# Patient Record
Sex: Female | Born: 1950 | Race: White | Hispanic: No | Marital: Married | Smoking: Former smoker
Health system: Southern US, Community
[De-identification: ages and names within clinical notes are randomized; demographics above are authoritative.]

## PROBLEM LIST (undated history)

## (undated) DIAGNOSIS — K219 Gastro-esophageal reflux disease without esophagitis: Secondary | ICD-10-CM

## (undated) DIAGNOSIS — F329 Major depressive disorder, single episode, unspecified: Secondary | ICD-10-CM

## (undated) DIAGNOSIS — F32A Depression, unspecified: Secondary | ICD-10-CM

## (undated) HISTORY — PX: ABDOMINAL HYSTERECTOMY: SHX81

## (undated) HISTORY — PX: CHOLECYSTECTOMY: SHX55

## (undated) HISTORY — PX: TONSILLECTOMY: SUR1361

---

## 2005-02-02 ENCOUNTER — Ambulatory Visit: Payer: Self-pay | Admitting: Specialist

## 2005-06-30 ENCOUNTER — Ambulatory Visit: Payer: Self-pay | Admitting: Cardiology

## 2006-09-22 ENCOUNTER — Ambulatory Visit: Payer: Self-pay | Admitting: Unknown Physician Specialty

## 2006-10-26 ENCOUNTER — Ambulatory Visit: Payer: Self-pay | Admitting: Unknown Physician Specialty

## 2006-11-26 ENCOUNTER — Emergency Department: Payer: Self-pay | Admitting: Internal Medicine

## 2006-12-29 ENCOUNTER — Ambulatory Visit: Payer: Self-pay | Admitting: Nurse Practitioner

## 2007-08-04 ENCOUNTER — Ambulatory Visit: Payer: Self-pay | Admitting: Family Medicine

## 2008-08-21 ENCOUNTER — Ambulatory Visit: Payer: Self-pay | Admitting: Internal Medicine

## 2009-09-26 ENCOUNTER — Ambulatory Visit: Payer: Self-pay | Admitting: Family Medicine

## 2010-01-30 ENCOUNTER — Ambulatory Visit: Payer: Self-pay | Admitting: Family Medicine

## 2010-06-17 ENCOUNTER — Ambulatory Visit: Payer: Self-pay | Admitting: Unknown Physician Specialty

## 2010-08-07 ENCOUNTER — Ambulatory Visit: Payer: Self-pay | Admitting: Family Medicine

## 2010-09-29 ENCOUNTER — Ambulatory Visit: Payer: Self-pay | Admitting: Obstetrics and Gynecology

## 2011-02-04 ENCOUNTER — Ambulatory Visit: Payer: Self-pay | Admitting: Unknown Physician Specialty

## 2011-05-24 ENCOUNTER — Ambulatory Visit: Payer: Self-pay | Admitting: Internal Medicine

## 2011-06-05 ENCOUNTER — Other Ambulatory Visit: Payer: Self-pay | Admitting: Unknown Physician Specialty

## 2011-06-22 ENCOUNTER — Ambulatory Visit: Payer: Self-pay | Admitting: Internal Medicine

## 2011-06-25 DIAGNOSIS — K219 Gastro-esophageal reflux disease without esophagitis: Secondary | ICD-10-CM | POA: Insufficient documentation

## 2011-06-25 DIAGNOSIS — M8580 Other specified disorders of bone density and structure, unspecified site: Secondary | ICD-10-CM | POA: Insufficient documentation

## 2011-06-25 DIAGNOSIS — Z8719 Personal history of other diseases of the digestive system: Secondary | ICD-10-CM | POA: Insufficient documentation

## 2011-10-01 ENCOUNTER — Ambulatory Visit: Payer: Self-pay | Admitting: Obstetrics and Gynecology

## 2011-10-09 DIAGNOSIS — E7800 Pure hypercholesterolemia, unspecified: Secondary | ICD-10-CM | POA: Insufficient documentation

## 2011-10-09 DIAGNOSIS — K582 Mixed irritable bowel syndrome: Secondary | ICD-10-CM | POA: Insufficient documentation

## 2012-01-07 ENCOUNTER — Ambulatory Visit: Payer: Self-pay | Admitting: Podiatry

## 2012-09-01 ENCOUNTER — Ambulatory Visit: Payer: Self-pay | Admitting: Family Medicine

## 2012-10-04 ENCOUNTER — Ambulatory Visit: Payer: Self-pay | Admitting: Family Medicine

## 2012-12-20 ENCOUNTER — Ambulatory Visit: Payer: Self-pay | Admitting: Family Medicine

## 2012-12-20 LAB — URINALYSIS, COMPLETE
Bacteria: NEGATIVE
Bilirubin,UR: NEGATIVE
Blood: NEGATIVE
Ketone: NEGATIVE
Ph: 6.5 (ref 4.5–8.0)
Protein: NEGATIVE
Specific Gravity: 1.01 (ref 1.003–1.030)

## 2013-04-05 ENCOUNTER — Ambulatory Visit: Payer: Self-pay | Admitting: Ophthalmology

## 2013-06-22 ENCOUNTER — Ambulatory Visit: Payer: Self-pay

## 2013-06-22 LAB — COMPREHENSIVE METABOLIC PANEL
Albumin: 4.2 g/dL (ref 3.4–5.0)
Alkaline Phosphatase: 74 U/L (ref 50–136)
Anion Gap: 12 (ref 7–16)
BUN: 13 mg/dL (ref 7–18)
Calcium, Total: 9.4 mg/dL (ref 8.5–10.1)
Chloride: 106 mmol/L (ref 98–107)
Co2: 25 mmol/L (ref 21–32)
Creatinine: 0.82 mg/dL (ref 0.60–1.30)
EGFR (African American): >60
EGFR (Non-African Amer.): >60
Glucose: 88 mg/dL (ref 65–99)
Osmolality: 285 (ref 275–301)
Potassium: 4 mmol/L (ref 3.5–5.1)
SGOT(AST): 11 U/L — ABNORMAL LOW (ref 15–37)
SGPT (ALT): 20 U/L (ref 12–78)
Total Protein: 7.8 g/dL (ref 6.4–8.2)

## 2013-06-22 LAB — CBC WITH DIFFERENTIAL/PLATELET
Basophil #: 0.1 10*3/uL (ref 0.0–0.1)
Basophil %: 1.3 %
Eosinophil #: 0.1 10*3/uL (ref 0.0–0.7)
Eosinophil %: 1.5 %
HGB: 14.7 g/dL (ref 12.0–16.0)
Lymphocyte #: 2.8 10*3/uL (ref 1.0–3.6)
Lymphocyte %: 29.8 %
MCV: 83 fL (ref 80–100)
Monocyte #: 0.7 x10 3/mm (ref 0.2–0.9)
Neutrophil %: 60 %
RBC: 5.2 10*6/uL (ref 3.80–5.20)
WBC: 9.4 10*3/uL (ref 3.6–11.0)

## 2013-06-22 LAB — URINALYSIS, COMPLETE
Glucose,UR: NEGATIVE mg/dL (ref 0–75)
Nitrite: NEGATIVE
Protein: NEGATIVE
RBC,UR: NONE SEEN /HPF (ref 0–5)
Specific Gravity: 1.02 (ref 1.003–1.030)

## 2013-06-22 LAB — MAGNESIUM: Magnesium: 2.1 mg/dL

## 2013-06-24 LAB — URINE CULTURE

## 2013-10-10 ENCOUNTER — Ambulatory Visit: Payer: Self-pay | Admitting: Internal Medicine

## 2014-02-05 ENCOUNTER — Ambulatory Visit: Payer: Self-pay | Admitting: Family Medicine

## 2014-02-05 LAB — BASIC METABOLIC PANEL
Anion Gap: 10 (ref 7–16)
BUN: 11 mg/dL (ref 7–18)
CO2: 27 mmol/L (ref 21–32)
Calcium, Total: 9 mg/dL (ref 8.5–10.1)
Chloride: 105 mmol/L (ref 98–107)
Creatinine: 0.73 mg/dL (ref 0.60–1.30)
EGFR (African American): >60
EGFR (Non-African Amer.): >60
Glucose: 95 mg/dL (ref 65–99)
OSMOLALITY: 282 (ref 275–301)
Potassium: 4.2 mmol/L (ref 3.5–5.1)
Sodium: 142 mmol/L (ref 136–145)

## 2014-02-05 LAB — CBC WITH DIFFERENTIAL/PLATELET
BASOS ABS: 0.1 10*3/uL (ref 0.0–0.1)
Basophil %: 1.2 %
EOS ABS: 0.2 10*3/uL (ref 0.0–0.7)
Eosinophil %: 2 %
HCT: 44.3 % (ref 35.0–47.0)
HGB: 14.2 g/dL (ref 12.0–16.0)
Lymphocyte #: 2.7 10*3/uL (ref 1.0–3.6)
Lymphocyte %: 30.5 %
MCH: 27.3 pg (ref 26.0–34.0)
MCHC: 32 g/dL (ref 32.0–36.0)
MCV: 85 fL (ref 80–100)
MONO ABS: 0.7 x10 3/mm (ref 0.2–0.9)
Monocyte %: 7.8 %
NEUTROS ABS: 5.2 10*3/uL (ref 1.4–6.5)
NEUTROS PCT: 58.5 %
PLATELETS: 288 10*3/uL (ref 150–440)
RBC: 5.19 10*6/uL (ref 3.80–5.20)
RDW: 14.1 % (ref 11.5–14.5)
WBC: 8.9 10*3/uL (ref 3.6–11.0)

## 2014-02-05 LAB — MAGNESIUM: MAGNESIUM: 2.3 mg/dL

## 2014-02-05 LAB — MONONUCLEOSIS SCREEN: Mono Test: NEGATIVE

## 2014-06-19 ENCOUNTER — Ambulatory Visit: Payer: Self-pay | Admitting: Unknown Physician Specialty

## 2014-07-13 DIAGNOSIS — M169 Osteoarthritis of hip, unspecified: Secondary | ICD-10-CM | POA: Insufficient documentation

## 2014-08-18 ENCOUNTER — Ambulatory Visit: Payer: Self-pay | Admitting: Emergency Medicine

## 2014-10-12 ENCOUNTER — Ambulatory Visit: Payer: Self-pay | Admitting: Family Medicine

## 2015-04-01 ENCOUNTER — Other Ambulatory Visit: Payer: Self-pay | Admitting: Family Medicine

## 2015-04-01 DIAGNOSIS — M8580 Other specified disorders of bone density and structure, unspecified site: Secondary | ICD-10-CM

## 2015-04-23 ENCOUNTER — Ambulatory Visit
Admission: RE | Admit: 2015-04-23 | Discharge: 2015-04-23 | Disposition: A | Discharge status: Home / Self Care | Payer: BLUE CROSS/BLUE SHIELD | Source: Ambulatory Visit | Attending: Family Medicine | Admitting: Family Medicine

## 2015-04-23 DIAGNOSIS — M8580 Other specified disorders of bone density and structure, unspecified site: Secondary | ICD-10-CM | POA: Diagnosis not present

## 2015-09-23 ENCOUNTER — Other Ambulatory Visit: Payer: Self-pay | Admitting: Family Medicine

## 2015-09-23 DIAGNOSIS — Z1231 Encounter for screening mammogram for malignant neoplasm of breast: Secondary | ICD-10-CM

## 2015-10-16 ENCOUNTER — Ambulatory Visit: Payer: BLUE CROSS/BLUE SHIELD

## 2015-10-22 ENCOUNTER — Ambulatory Visit
Admission: RE | Admit: 2015-10-22 | Discharge: 2015-10-22 | Disposition: A | Discharge status: Home / Self Care | Payer: BLUE CROSS/BLUE SHIELD | Source: Ambulatory Visit | Attending: Family Medicine | Admitting: Family Medicine

## 2015-10-22 DIAGNOSIS — Z1231 Encounter for screening mammogram for malignant neoplasm of breast: Secondary | ICD-10-CM | POA: Diagnosis present

## 2015-11-12 ENCOUNTER — Encounter: Payer: Self-pay | Admitting: Medical Oncology

## 2015-11-12 ENCOUNTER — Emergency Department: Payer: BLUE CROSS/BLUE SHIELD

## 2015-11-12 ENCOUNTER — Emergency Department
Admission: EM | Admit: 2015-11-12 | Discharge: 2015-11-12 | Disposition: A | Discharge status: Home / Self Care | Payer: BLUE CROSS/BLUE SHIELD | Attending: Emergency Medicine | Admitting: Emergency Medicine

## 2015-11-12 DIAGNOSIS — K219 Gastro-esophageal reflux disease without esophagitis: Secondary | ICD-10-CM | POA: Insufficient documentation

## 2015-11-12 DIAGNOSIS — R079 Chest pain, unspecified: Secondary | ICD-10-CM | POA: Diagnosis present

## 2015-11-12 DIAGNOSIS — F17200 Nicotine dependence, unspecified, uncomplicated: Secondary | ICD-10-CM | POA: Diagnosis not present

## 2015-11-12 HISTORY — DX: Depression, unspecified: F32.A

## 2015-11-12 HISTORY — DX: Major depressive disorder, single episode, unspecified: F32.9

## 2015-11-12 HISTORY — DX: Gastro-esophageal reflux disease without esophagitis: K21.9

## 2015-11-12 LAB — BASIC METABOLIC PANEL
ANION GAP: 7 (ref 5–15)
BUN: 15 mg/dL (ref 6–20)
CALCIUM: 9 mg/dL (ref 8.9–10.3)
CO2: 22 mmol/L (ref 22–32)
Chloride: 111 mmol/L (ref 101–111)
Creatinine, Ser: 0.78 mg/dL (ref 0.44–1.00)
GFR calc Af Amer: >60 mL/min (ref >60)
GFR calc non Af Amer: >60 mL/min (ref >60)
GLUCOSE: 129 mg/dL — AB (ref 65–99)
POTASSIUM: 3.5 mmol/L (ref 3.5–5.1)
Sodium: 140 mmol/L (ref 135–145)

## 2015-11-12 LAB — CBC
HEMATOCRIT: 40.6 % (ref 35.0–47.0)
HEMOGLOBIN: 13.6 g/dL (ref 12.0–16.0)
MCH: 27.9 pg (ref 26.0–34.0)
MCHC: 33.6 g/dL (ref 32.0–36.0)
MCV: 83.2 fL (ref 80.0–100.0)
Platelets: 259 10*3/uL (ref 150–440)
RBC: 4.88 MIL/uL (ref 3.80–5.20)
RDW: 14.2 % (ref 11.5–14.5)
WBC: 10.7 10*3/uL (ref 3.6–11.0)

## 2015-11-12 LAB — TROPONIN I: Troponin I: <0.03 ng/mL (ref <0.031)

## 2015-11-12 MED ORDER — GI COCKTAIL ~~LOC~~
30.0000 mL | Freq: Once | ORAL | Status: AC
  Administered 2015-11-12: 30 mL via ORAL

## 2015-11-12 MED ORDER — SUCRALFATE 1 G PO TABS: 1.0000 g | ORAL_TABLET | Freq: Four times a day (QID) | ORAL | Status: DC

## 2015-11-12 MED ORDER — GI COCKTAIL ~~LOC~~
ORAL | Status: AC
  Filled 2015-11-12: qty 30

## 2015-11-12 NOTE — ED Provider Notes (Signed)
[facility] Emergency Department Provider Note   ____________________________________________  Time seen: 2030  I have reviewed the triage vital signs and the nursing notes.   HISTORY  Chief Complaint Chest Pain   History limited by: Not Limited   HPI Carly Lopez is a 64 y.o. female with a history of heartburn and presents to the emergency department today with concerns for chest pain and throat pain. Patient states that the symptoms started 3 days ago. Patient describes the pain primarily is burning. She also feels a bad taste in her throat. It has been intermittent. It is worse when she lies flat. She states she does have a history of heartburn instability on Protonix for that. She states she was off of her Protonix for a couple of weeks when she was out of town. She states that in addition she has been taking a larger than normal amount of ibuprofen recently for knee pain. Patient denies any shortness of breath or diaphoresis. She denies any fevers.  Past Medical History  Diagnosis Date  . GERD (gastroesophageal reflux disease)   . Depression     There are no active problems to display for this patient.   Past Surgical History  Procedure Laterality Date  . Abdominal hysterectomy    . Cholecystectomy    . Tonsillectomy      No current outpatient prescriptions on file.  Allergies Aleve and Levaquin  Family History  Problem Relation Age of Onset  . Breast cancer Maternal Aunt     Social History Social History  Substance Use Topics  . Smoking status: Current Every Day Smoker  . Smokeless tobacco: None  . Alcohol Use: No    Review of Systems  Constitutional: Negative for fever. Cardiovascular: Positive for chest pain. Respiratory: Negative for shortness of breath. Gastrointestinal: Negative for abdominal pain, vomiting and diarrhea. Genitourinary: Negative for dysuria. Neurological: Negative for headaches, focal  weakness or numbness.   10-point ROS otherwise negative.  ____________________________________________   PHYSICAL EXAM:  VITAL SIGNS: ED Triage Vitals  Enc Vitals Group     BP 11/12/15 1840 117/58 mmHg     Pulse Rate 11/12/15 1840 67     Resp 11/12/15 1840 19     Temp 11/12/15 1840 98.1 F (36.7 C)     Temp Source 11/12/15 1840 Oral     SpO2 11/12/15 1840 96 %     Weight 11/12/15 1840 156 lb (70.761 kg)     Height 11/12/15 1840 '5\' 4"'$  (1.626 m)     Head Cir --      Peak Flow --      Pain Score 11/12/15 1841 8   Constitutional: Alert and oriented. Well appearing and in no distress. Eyes: Conjunctivae are normal. PERRL. Normal extraocular movements. ENT   Head: Normocephalic and atraumatic.   Nose: No congestion/rhinnorhea.   Mouth/Throat: Mucous membranes are moist.   Neck: No stridor. Hematological/Lymphatic/Immunilogical: No cervical lymphadenopathy. Cardiovascular: Normal rate, regular rhythm.  No murmurs, rubs, or gallops. Respiratory: Normal respiratory effort without tachypnea nor retractions. Breath sounds are clear and equal bilaterally. No wheezes/rales/rhonchi. Gastrointestinal: Soft and nontender. No distention. There is no CVA tenderness. Genitourinary: Deferred Musculoskeletal: Normal range of motion in all extremities. No joint effusions.  No lower extremity tenderness nor edema. Neurologic:  Normal speech and language. No gross focal neurologic deficits are appreciated.  Skin:  Skin is warm, dry and intact. No rash noted. Psychiatric: Mood and affect are normal. Speech and behavior are normal. Patient  exhibits appropriate insight and judgment.  ____________________________________________    LABS (pertinent positives/negatives)  Labs Reviewed  BASIC METABOLIC PANEL - Abnormal; Notable for the following:    Glucose, Bld 129 (*)    All other components within normal limits  CBC  TROPONIN I     ____________________________________________   EKG  I, Nance Pear, attending physician, personally viewed and interpreted this EKG  EKG Time: 1837 Rate: 66 Rhythm: NSR Axis: normal Intervals: qtc 448 QRS: narrow ST changes: no st elevation Impression: normal ekg  ____________________________________________    RADIOLOGY  CXR  IMPRESSION: Chronic bronchitic changes in the lungs. Linear fibrosis or atelectasis in the left base. No evidence of active pulmonary disease.  ____________________________________________   PROCEDURES  Procedure(s) performed: None  Critical Care performed: No  ____________________________________________   INITIAL IMPRESSION / ASSESSMENT AND PLAN / ED COURSE  Pertinent labs & imaging results that were available during my care of the patient were reviewed by me and considered in my medical decision making (see chart for details).  Patient presented to the emergency department today because of concerns for chest pain. EKG chest x-ray and blood work without concerning findings. Patient does have a history of heartburn and has had a couple of recent risk factors including ibuprofen use as well as not taking her antacids. Given the lack of findings concerning for ACS will treat presumptively for GERD. Will give patient prescription for sucralfate.  ____________________________________________   FINAL CLINICAL IMPRESSION(S) / ED DIAGNOSES  Final diagnoses:  Gastroesophageal reflux disease, esophagitis presence not specified     Nance Pear, MD 11/12/15 2130

## 2015-11-12 NOTE — Discharge Instructions (Signed)
Please seek medical attention for any high fevers, chest pain, shortness of breath, change in behavior, persistent vomiting, bloody stool or any other new or concerning symptoms.   Gastroesophageal Reflux Disease, Adult Normally, food travels down the esophagus and stays in the stomach to be digested. If a person has gastroesophageal reflux disease (GERD), food and stomach acid move back up into the esophagus. When this happens, the esophagus becomes sore and swollen (inflamed). Over time, GERD can make small holes (ulcers) in the lining of the esophagus. HOME CARE Diet  Follow a diet as told by your doctor. You may need to avoid foods and drinks such as:  Coffee and tea (with or without caffeine).  Drinks that contain alcohol.  Energy drinks and sports drinks.  Carbonated drinks or sodas.  Chocolate and cocoa.  Peppermint and mint flavorings.  Garlic and onions.  Horseradish.  Spicy and acidic foods, such as peppers, chili powder, curry powder, vinegar, hot sauces, and BBQ sauce.  Citrus fruit juices and citrus fruits, such as oranges, lemons, and limes.  Tomato-based foods, such as red sauce, chili, salsa, and pizza with red sauce.  Fried and fatty foods, such as donuts, french fries, potato chips, and high-fat dressings.  High-fat meats, such as hot dogs, rib eye steak, sausage, ham, and bacon.  High-fat dairy items, such as whole milk, butter, and cream cheese.  Eat small meals often. Avoid eating large meals.  Avoid drinking large amounts of liquid with your meals.  Avoid eating meals during the 2-3 hours before bedtime.  Avoid lying down right after you eat.  Do not exercise right after you eat. General Instructions  Pay attention to any changes in your symptoms.  Take over-the-counter and prescription medicines only as told by your doctor. Do not take aspirin, ibuprofen, or other NSAIDs unless your doctor says it is okay.  Do not use any tobacco  products, including cigarettes, chewing tobacco, and e-cigarettes. If you need help quitting, ask your doctor.  Wear loose clothes. Do not wear anything tight around your waist.  Raise (elevate) the head of your bed about 6 inches (15 cm).  Try to lower your stress. If you need help doing this, ask your doctor.  If you are overweight, lose an amount of weight that is healthy for you. Ask your doctor about a safe weight loss goal.  Keep all follow-up visits as told by your doctor. This is important. GET HELP IF:  You have new symptoms.  You lose weight and you do not know why it is happening.  You have trouble swallowing, or it hurts to swallow.  You have wheezing or a cough that keeps happening.  Your symptoms do not get better with treatment.  You have a hoarse voice. GET HELP RIGHT AWAY IF:  You have pain in your arms, neck, jaw, teeth, or back.  You feel sweaty, dizzy, or light-headed.  You have chest pain or shortness of breath.  You throw up (vomit) and your throw up looks like blood or coffee grounds.  You pass out (faint).  Your poop (stool) is bloody or black.  You cannot swallow, drink, or eat.   This information is not intended to replace advice given to you by your health care provider. Make sure you discuss any questions you have with your health care provider.   Document Released: 05/04/2008 Document Revised: 08/07/2015 Document Reviewed: 03/13/2015 Elsevier Interactive Patient Education [employer].

## 2015-11-12 NOTE — ED Notes (Signed)
Dr. Goodman at bedside.  

## 2015-11-12 NOTE — ED Notes (Addendum)
Pt reports that she began Saturday with nausea and vomited x 1, pt then reports she began having central chest burning sensation, which has been intermittent since, reports that she is not having sob at this time. Pain radiates into rt shoulder and neck.

## 2015-11-12 NOTE — ED Notes (Signed)
Pt presents to ED with 1 episode of N&V Friday, chest burning with hx of acid reflux, pt takes protonix, pt's Dr stated she needed to be seen at emergency department. Pt states her mouth has terrible taste, feels like something is stuck in throat.

## 2016-04-10 ENCOUNTER — Other Ambulatory Visit: Payer: Self-pay | Admitting: Ophthalmology

## 2016-04-10 DIAGNOSIS — R519 Headache, unspecified: Secondary | ICD-10-CM

## 2016-04-10 DIAGNOSIS — R51 Headache: Principal | ICD-10-CM

## 2016-04-17 ENCOUNTER — Ambulatory Visit
Admission: RE | Admit: 2016-04-17 | Discharge: 2016-04-17 | Disposition: A | Discharge status: Home / Self Care | Payer: BLUE CROSS/BLUE SHIELD | Source: Ambulatory Visit | Attending: Ophthalmology | Admitting: Ophthalmology

## 2016-04-17 DIAGNOSIS — R600 Localized edema: Secondary | ICD-10-CM | POA: Diagnosis not present

## 2016-04-17 DIAGNOSIS — R519 Headache, unspecified: Secondary | ICD-10-CM

## 2016-04-17 DIAGNOSIS — R51 Headache: Secondary | ICD-10-CM | POA: Diagnosis present

## 2016-09-21 ENCOUNTER — Other Ambulatory Visit: Payer: Self-pay | Admitting: Family Medicine

## 2016-09-21 DIAGNOSIS — Z1231 Encounter for screening mammogram for malignant neoplasm of breast: Secondary | ICD-10-CM

## 2016-10-26 ENCOUNTER — Ambulatory Visit: Admission: RE | Admit: 2016-10-26 | Payer: BLUE CROSS/BLUE SHIELD | Source: Ambulatory Visit

## 2016-10-30 ENCOUNTER — Ambulatory Visit
Admission: RE | Admit: 2016-10-30 | Discharge: 2016-10-30 | Disposition: A | Discharge status: Home / Self Care | Payer: BLUE CROSS/BLUE SHIELD | Source: Ambulatory Visit | Attending: Family Medicine | Admitting: Family Medicine

## 2016-10-30 DIAGNOSIS — Z1231 Encounter for screening mammogram for malignant neoplasm of breast: Secondary | ICD-10-CM | POA: Diagnosis present

## 2017-11-17 ENCOUNTER — Other Ambulatory Visit: Payer: Self-pay | Admitting: Family Medicine

## 2017-11-17 DIAGNOSIS — Z1231 Encounter for screening mammogram for malignant neoplasm of breast: Secondary | ICD-10-CM

## 2017-12-07 ENCOUNTER — Ambulatory Visit
Admission: RE | Admit: 2017-12-07 | Discharge: 2017-12-07 | Disposition: A | Discharge status: Home / Self Care | Payer: BLUE CROSS/BLUE SHIELD | Source: Ambulatory Visit | Attending: Family Medicine | Admitting: Family Medicine

## 2017-12-07 ENCOUNTER — Encounter (INDEPENDENT_AMBULATORY_CARE_PROVIDER_SITE_OTHER): Payer: Self-pay

## 2017-12-07 DIAGNOSIS — Z1231 Encounter for screening mammogram for malignant neoplasm of breast: Secondary | ICD-10-CM | POA: Diagnosis present

## 2018-02-28 ENCOUNTER — Other Ambulatory Visit: Payer: Self-pay | Admitting: Unknown Physician Specialty

## 2018-02-28 DIAGNOSIS — G8929 Other chronic pain: Secondary | ICD-10-CM

## 2018-02-28 DIAGNOSIS — M5440 Lumbago with sciatica, unspecified side: Principal | ICD-10-CM

## 2018-03-10 ENCOUNTER — Ambulatory Visit
Admission: RE | Admit: 2018-03-10 | Discharge: 2018-03-10 | Disposition: A | Discharge status: Home / Self Care | Payer: BLUE CROSS/BLUE SHIELD | Source: Ambulatory Visit | Attending: Unknown Physician Specialty | Admitting: Unknown Physician Specialty

## 2018-03-10 DIAGNOSIS — M5440 Lumbago with sciatica, unspecified side: Secondary | ICD-10-CM | POA: Insufficient documentation

## 2018-03-10 DIAGNOSIS — M48061 Spinal stenosis, lumbar region without neurogenic claudication: Secondary | ICD-10-CM | POA: Diagnosis not present

## 2018-03-10 DIAGNOSIS — M5126 Other intervertebral disc displacement, lumbar region: Secondary | ICD-10-CM | POA: Insufficient documentation

## 2018-03-10 DIAGNOSIS — G8929 Other chronic pain: Secondary | ICD-10-CM | POA: Diagnosis present

## 2018-03-24 DIAGNOSIS — Q790 Congenital diaphragmatic hernia: Secondary | ICD-10-CM | POA: Insufficient documentation

## 2018-12-08 ENCOUNTER — Other Ambulatory Visit: Payer: Self-pay | Admitting: Family Medicine

## 2018-12-08 ENCOUNTER — Other Ambulatory Visit: Payer: Self-pay

## 2018-12-08 DIAGNOSIS — Z1231 Encounter for screening mammogram for malignant neoplasm of breast: Secondary | ICD-10-CM

## 2018-12-19 ENCOUNTER — Other Ambulatory Visit: Payer: Self-pay | Admitting: Unknown Physician Specialty

## 2018-12-19 DIAGNOSIS — G8929 Other chronic pain: Secondary | ICD-10-CM

## 2018-12-19 DIAGNOSIS — M5440 Lumbago with sciatica, unspecified side: Principal | ICD-10-CM

## 2018-12-21 ENCOUNTER — Ambulatory Visit
Admission: RE | Admit: 2018-12-21 | Discharge: 2018-12-21 | Disposition: A | Discharge status: Home / Self Care | Payer: Medicare Other | Source: Ambulatory Visit | Attending: Family Medicine | Admitting: Family Medicine

## 2018-12-21 DIAGNOSIS — Z1231 Encounter for screening mammogram for malignant neoplasm of breast: Secondary | ICD-10-CM | POA: Diagnosis not present

## 2018-12-29 ENCOUNTER — Ambulatory Visit: Admission: RE | Admit: 2018-12-29 | Payer: Medicare Other | Source: Ambulatory Visit

## 2018-12-29 DIAGNOSIS — G459 Transient cerebral ischemic attack, unspecified: Secondary | ICD-10-CM | POA: Insufficient documentation

## 2019-01-03 ENCOUNTER — Ambulatory Visit
Admission: RE | Admit: 2019-01-03 | Discharge: 2019-01-03 | Disposition: A | Discharge status: Home / Self Care | Payer: Medicare Other | Source: Ambulatory Visit | Attending: Unknown Physician Specialty | Admitting: Unknown Physician Specialty

## 2019-01-03 ENCOUNTER — Encounter (INDEPENDENT_AMBULATORY_CARE_PROVIDER_SITE_OTHER): Payer: Self-pay

## 2019-01-03 DIAGNOSIS — G8929 Other chronic pain: Secondary | ICD-10-CM | POA: Insufficient documentation

## 2019-01-03 DIAGNOSIS — M5440 Lumbago with sciatica, unspecified side: Secondary | ICD-10-CM | POA: Insufficient documentation

## 2019-01-03 MED ORDER — GADOBUTROL 1 MMOL/ML IV SOLN
7.0000 mL | Freq: Once | INTRAVENOUS | Status: AC | PRN
  Administered 2019-01-03: 7 mL via INTRAVENOUS

## 2019-01-04 DIAGNOSIS — F17200 Nicotine dependence, unspecified, uncomplicated: Secondary | ICD-10-CM | POA: Insufficient documentation

## 2019-04-04 DIAGNOSIS — Z8673 Personal history of transient ischemic attack (TIA), and cerebral infarction without residual deficits: Secondary | ICD-10-CM | POA: Insufficient documentation

## 2019-05-03 ENCOUNTER — Other Ambulatory Visit: Payer: Self-pay | Admitting: Family Medicine

## 2019-05-03 DIAGNOSIS — M79652 Pain in left thigh: Secondary | ICD-10-CM

## 2019-05-08 ENCOUNTER — Encounter (INDEPENDENT_AMBULATORY_CARE_PROVIDER_SITE_OTHER): Payer: Self-pay

## 2019-05-08 ENCOUNTER — Other Ambulatory Visit: Payer: Self-pay

## 2019-05-08 ENCOUNTER — Ambulatory Visit
Admission: RE | Admit: 2019-05-08 | Discharge: 2019-05-08 | Disposition: A | Discharge status: Home / Self Care | Payer: Medicare Other | Source: Ambulatory Visit | Attending: Family Medicine | Admitting: Family Medicine

## 2019-05-08 DIAGNOSIS — M79652 Pain in left thigh: Secondary | ICD-10-CM | POA: Insufficient documentation

## 2019-09-25 DIAGNOSIS — G43109 Migraine with aura, not intractable, without status migrainosus: Secondary | ICD-10-CM | POA: Insufficient documentation

## 2019-10-09 ENCOUNTER — Other Ambulatory Visit: Payer: Self-pay | Admitting: Family Medicine

## 2019-10-09 DIAGNOSIS — Z1231 Encounter for screening mammogram for malignant neoplasm of breast: Secondary | ICD-10-CM

## 2019-10-09 DIAGNOSIS — E2839 Other primary ovarian failure: Secondary | ICD-10-CM

## 2019-10-20 ENCOUNTER — Other Ambulatory Visit: Payer: Self-pay | Admitting: Physician Assistant

## 2019-10-20 DIAGNOSIS — M79661 Pain in right lower leg: Secondary | ICD-10-CM

## 2019-10-23 ENCOUNTER — Ambulatory Visit
Admission: RE | Admit: 2019-10-23 | Discharge: 2019-10-23 | Disposition: A | Discharge status: Home / Self Care | Payer: Medicare Other | Source: Ambulatory Visit | Attending: Physician Assistant | Admitting: Physician Assistant

## 2019-10-23 ENCOUNTER — Other Ambulatory Visit: Payer: Self-pay

## 2019-10-23 ENCOUNTER — Encounter (INDEPENDENT_AMBULATORY_CARE_PROVIDER_SITE_OTHER): Payer: Self-pay

## 2019-10-23 DIAGNOSIS — M79661 Pain in right lower leg: Secondary | ICD-10-CM | POA: Diagnosis not present

## 2019-12-01 DIAGNOSIS — F324 Major depressive disorder, single episode, in partial remission: Secondary | ICD-10-CM | POA: Insufficient documentation

## 2019-12-01 DIAGNOSIS — I10 Essential (primary) hypertension: Secondary | ICD-10-CM | POA: Insufficient documentation

## 2020-01-16 ENCOUNTER — Other Ambulatory Visit: Payer: Self-pay

## 2020-01-16 ENCOUNTER — Ambulatory Visit
Admission: RE | Admit: 2020-01-16 | Discharge: 2020-01-16 | Disposition: A | Discharge status: Home / Self Care | Payer: Medicare Other | Source: Ambulatory Visit | Attending: Family Medicine | Admitting: Family Medicine

## 2020-01-16 DIAGNOSIS — Z1231 Encounter for screening mammogram for malignant neoplasm of breast: Secondary | ICD-10-CM | POA: Diagnosis not present

## 2020-06-20 DIAGNOSIS — Z96651 Presence of right artificial knee joint: Secondary | ICD-10-CM | POA: Insufficient documentation

## 2020-06-23 DIAGNOSIS — Z471 Aftercare following joint replacement surgery: Secondary | ICD-10-CM | POA: Insufficient documentation

## 2020-10-31 ENCOUNTER — Other Ambulatory Visit: Payer: Self-pay | Admitting: Family Medicine

## 2020-10-31 DIAGNOSIS — Z780 Asymptomatic menopausal state: Secondary | ICD-10-CM

## 2020-12-09 ENCOUNTER — Other Ambulatory Visit: Payer: Self-pay | Admitting: Family Medicine

## 2020-12-09 DIAGNOSIS — Z1231 Encounter for screening mammogram for malignant neoplasm of breast: Secondary | ICD-10-CM

## 2021-01-05 DIAGNOSIS — C3411 Malignant neoplasm of upper lobe, right bronchus or lung: Secondary | ICD-10-CM

## 2021-01-05 DIAGNOSIS — F5101 Primary insomnia: Secondary | ICD-10-CM | POA: Insufficient documentation

## 2021-01-05 HISTORY — DX: Malignant neoplasm of upper lobe, right bronchus or lung: C34.11

## 2021-01-16 ENCOUNTER — Ambulatory Visit
Admission: RE | Admit: 2021-01-16 | Discharge: 2021-01-16 | Disposition: A | Discharge status: Home / Self Care | Payer: Medicare Other | Source: Ambulatory Visit | Attending: Family Medicine | Admitting: Family Medicine

## 2021-01-16 ENCOUNTER — Other Ambulatory Visit: Payer: Self-pay

## 2021-01-16 DIAGNOSIS — Z13820 Encounter for screening for osteoporosis: Secondary | ICD-10-CM | POA: Insufficient documentation

## 2021-01-16 DIAGNOSIS — M810 Age-related osteoporosis without current pathological fracture: Secondary | ICD-10-CM | POA: Diagnosis not present

## 2021-01-16 DIAGNOSIS — Z1231 Encounter for screening mammogram for malignant neoplasm of breast: Secondary | ICD-10-CM | POA: Insufficient documentation

## 2021-01-16 DIAGNOSIS — Z780 Asymptomatic menopausal state: Secondary | ICD-10-CM | POA: Diagnosis not present

## 2021-04-18 DIAGNOSIS — N1831 Chronic kidney disease, stage 3a: Secondary | ICD-10-CM | POA: Insufficient documentation

## 2021-06-09 DIAGNOSIS — Z87891 Personal history of nicotine dependence: Secondary | ICD-10-CM | POA: Insufficient documentation

## 2021-07-03 ENCOUNTER — Encounter: Payer: Self-pay | Admitting: Dermatology

## 2021-07-03 ENCOUNTER — Ambulatory Visit (INDEPENDENT_AMBULATORY_CARE_PROVIDER_SITE_OTHER): Payer: Medicare Other | Admitting: Dermatology

## 2021-07-03 ENCOUNTER — Other Ambulatory Visit: Payer: Self-pay

## 2021-07-03 DIAGNOSIS — L988 Other specified disorders of the skin and subcutaneous tissue: Secondary | ICD-10-CM

## 2021-07-03 DIAGNOSIS — I781 Nevus, non-neoplastic: Secondary | ICD-10-CM | POA: Diagnosis not present

## 2021-07-03 DIAGNOSIS — D2239 Melanocytic nevi of other parts of face: Secondary | ICD-10-CM

## 2021-07-03 DIAGNOSIS — L814 Other melanin hyperpigmentation: Secondary | ICD-10-CM

## 2021-07-03 DIAGNOSIS — L720 Epidermal cyst: Secondary | ICD-10-CM

## 2021-07-03 DIAGNOSIS — Z1283 Encounter for screening for malignant neoplasm of skin: Secondary | ICD-10-CM | POA: Diagnosis not present

## 2021-07-03 DIAGNOSIS — L820 Inflamed seborrheic keratosis: Secondary | ICD-10-CM | POA: Diagnosis not present

## 2021-07-03 DIAGNOSIS — D229 Melanocytic nevi, unspecified: Secondary | ICD-10-CM

## 2021-07-03 DIAGNOSIS — L578 Other skin changes due to chronic exposure to nonionizing radiation: Secondary | ICD-10-CM

## 2021-07-03 DIAGNOSIS — L700 Acne vulgaris: Secondary | ICD-10-CM

## 2021-07-03 DIAGNOSIS — L821 Other seborrheic keratosis: Secondary | ICD-10-CM

## 2021-07-03 DIAGNOSIS — D1800 Hemangioma unspecified site: Secondary | ICD-10-CM

## 2021-07-03 NOTE — Patient Instructions (Addendum)
If you have any questions or concerns for your doctor, please call our main line at [phone] and press option 4 to reach your doctor's medical assistant. If no one answers, please leave a voicemail as directed and we will return your call as soon as possible. Messages left after 4 pm will be answered the following business day.   You may also send [place] a message via [place]. We typically respond to MyChart messages within 1-2 business days.  For prescription refills, please ask your pharmacy to contact our office. Our fax number is [phone].  If you have an urgent issue when the clinic is closed that cannot wait until the next business day, you can page your doctor at the number below.    Please note that while we do our best to be available for urgent issues outside of office hours, we are not available 24/7.   If you have an urgent issue and are unable to reach [place], you may choose to seek medical care at your doctor's office, retail clinic, urgent care center, or emergency room.  If you have a medical emergency, please immediately call 911 or go to the emergency department.  Pager Numbers  - Dr. Nehemiah Massed: [phone]  - Dr. Laurence Ferrari: [phone]  - Dr. Nicole Kindred: [phone]  In the event of inclement weather, please call our main line at [phone] for an update on the status of any delays or closures.  Dermatology Medication Tips: Please keep the boxes that topical medications come in in order to help keep track of the instructions about where and how to use these. Pharmacies typically print the medication instructions only on the boxes and not directly on the medication tubes.   If your medication is too expensive, please contact our office at [phone] option 4 or send [place] a message through [place].   We are unable to tell what your co-pay for medications will be in advance as this is different depending on your insurance coverage. However, we may be able to find a substitute  medication at lower cost or fill out paperwork to get insurance to cover a needed medication.   If a prior authorization is required to get your medication covered by your insurance company, please allow [place] 1-2 business days to complete this process.  Drug prices often vary depending on where the prescription is filled and some pharmacies may offer cheaper prices.  The website www.goodrx.com contains coupons for medications through different pharmacies. The prices here do not account for what the cost may be with help from insurance (it may be cheaper with your insurance), but the website can give you the price if you did not use any insurance.  - You can print the associated coupon and take it with your prescription to the pharmacy.  - You may also stop by our office during regular business hours and pick up a GoodRx coupon card.  - If you need your prescription sent electronically to a different pharmacy, notify our office through [facility] or by phone at [phone] option 4.    Pre-Operative Instructions  You are scheduled for a surgical procedure at [facility]. We recommend you read the following instructions. If you have any questions or concerns, please call the office at [phone].  Shower and wash the entire body with soap and water the day of your surgery paying special attention to cleansing at and around the planned surgery site.  Avoid aspirin or aspirin containing products at least fourteen (14)  days prior to your surgical procedure and for at least one week (7 Days) after your surgical procedure. If you take aspirin on a regular basis for heart disease or history of stroke or for any other reason, we may recommend you continue taking aspirin but please notify us if you take this on a regular basis. Aspirin can cause more bleeding to occur during surgery as well as prolonged bleeding and bruising after surgery.   Avoid other nonsteroidal pain medications at  least one week prior to surgery and at least one week prior to your surgery. These include medications such as Ibuprofen (Motrin, Advil and Nuprin), Naprosyn, Voltaren, Relafen, etc. If medications are used for therapeutic reasons, please inform us as they can cause increased bleeding or prolonged bleeding during and bruising after surgical procedures.   Please advise [place] if you are taking any "blood thinner" medications such as Coumadin or Dipyridamole or Plavix or similar medications. These cause increased bleeding and prolonged bleeding during procedures and bruising after surgical procedures. We may have to consider discontinuing these medications briefly prior to and shortly after your surgery if safe to do so.   Please inform us of all medications you are currently taking. All medications that are taken regularly should be taken the day of surgery as you always do. Nevertheless, we need to be informed of what medications you are taking prior to surgery to know whether they will affect the procedure or cause any complications.   Please inform us of any medication allergies. Also inform us of whether you have allergies to Latex or rubber products or whether you have had any adverse reaction to Lidocaine or Epinephrine.  Please inform us of any prosthetic or artificial body parts such as artificial heart valve, joint replacements, etc., or similar condition that might require preoperative antibiotics.   We recommend avoidance of alcohol at least two weeks prior to surgery and continued avoidance for at least two weeks after surgery.   We recommend discontinuation of tobacco smoking at least two weeks prior to surgery and continued abstinence for at least two weeks after surgery.  Do not plan strenuous exercise, strenuous work or strenuous lifting for approximately four weeks after your surgery.   We request if you are unable to make your scheduled surgical appointment, please call us at least a  week in advance or as soon as you are aware of a problem so that we can cancel or reschedule the appointment.   You MAY TAKE TYLENOL (acetaminophen) for pain as it is not a blood thinner.   PLEASE PLAN TO BE IN TOWN FOR TWO WEEKS FOLLOWING SURGERY, THIS IS IMPORTANT SO YOU CAN BE CHECKED FOR DRESSING CHANGES, SUTURE REMOVAL AND TO MONITOR FOR POSSIBLE COMPLICATIONS.

## 2021-07-03 NOTE — Progress Notes (Signed)
Follow-Up Visit   Subjective  Carly Lopez is a 70 y.o. female who presents for the following: Annual Exam (Patient has a cyst in her R axilla and she would like to discuss treatment options.). She also has an irritated nevus on her face that she would like removed today, and an irritated skin lesion on the abdomen that is bothersome.  The patient presents for Total-Body Skin Exam (TBSE) for skin cancer screening and mole check.  The following portions of the chart were reviewed this encounter and updated as appropriate:   Allergies  Meds  Problems  Med Hx  Surg Hx  Fam Hx     Review of Systems:  No other skin or systemic complaints except as noted in HPI or Assessment and Plan.  Objective  Well appearing patient in no apparent distress; mood and affect are within normal limits.  A full examination was performed including scalp, head, eyes, ears, nose, lips, neck, chest, axillae, abdomen, back, buttocks, bilateral upper extremities, bilateral lower extremities, hands, feet, fingers, toes, fingernails, and toenails. All findings within normal limits unless otherwise noted below.   Assessment & Plan  Inflamed seborrheic keratosis LUQA x 1, R cheek x 1 Destruction of lesion - LUQA x 1, R cheek x 1 Complexity: simple   Destruction method: cryotherapy   Informed consent: discussed and consent obtained   Timeout:  patient name, date of birth, surgical site, and procedure verified Lesion destroyed using liquid nitrogen: Yes   Region frozen until ice ball extended beyond lesion: Yes   Outcome: patient tolerated procedure well with no complications   Post-procedure details: wound care instructions given    Epidermal inclusion cyst -recently inflamed Right Axilla Benign-appearing. Exam most consistent with an epidermal inclusion cyst. Discussed that a cyst is a benign growth that can grow over time and sometimes get irritated or inflamed. Recommend observation if it is not  bothersome. Discussed option of surgical excision to remove it if it is growing, symptomatic, or other changes noted. Please call for new or changing lesions so they can be evaluated.  Nevus -irritated. L chin Benign-appearing.  Observation.  Call clinic for new or changing moles.  Recommend daily use of broad spectrum spf 30+ sunscreen to sun-exposed areas.   Discussed shave removal if bothersome.  Patient declines today  Telangiectasia Face left nasal bridge Benign-appearing.  Observation.  Call clinic for new or changing lesions.  Recommend daily use of broad spectrum spf 30+ sunscreen to sun-exposed areas.  Discussed BBL laser Patient declines laser today.  Elastosis of skin Face Start Retin A 0.025% cream QHS. Topical retinoid medications like tretinoin/Retin-A, adapalene/Differin, tazarotene/Fabior, and Epiduo/Epiduo Forte can cause dryness and irritation when first started. Only apply a pea-sized amount to the entire affected area. Avoid applying it around the eyes, edges of mouth and creases at the nose. If you experience irritation, use a good moisturizer first and/or apply the medicine less often. If you are doing well with the medicine, you can increase how often you use it until you are applying every night. Be careful with sun protection while using this medication as it can make you sensitive to the sun. This medicine should not be used by pregnant women.   Acne vulgaris Face Chronic and persistent Start Retin A 0.025% cream QHS. Topical retinoid medications like tretinoin/Retin-A, adapalene/Differin, tazarotene/Fabior, and Epiduo/Epiduo Forte can cause dryness and irritation when first started. Only apply a pea-sized amount to the entire affected area. Avoid applying it around the  eyes, edges of mouth and creases at the nose. If you experience irritation, use a good moisturizer first and/or apply the medicine less often. If you are doing well with the medicine, you can increase  how often you use it until you are applying every night. Be careful with sun protection while using this medication as it can make you sensitive to the sun. This medicine should not be used by pregnant women.   Skin cancer screening  Lentigines - Scattered tan macules - Due to sun exposure - Benign-appering, observe - Recommend daily broad spectrum sunscreen SPF 30+ to sun-exposed areas, reapply every 2 hours as needed. - Call for any changes  Seborrheic Keratoses - Stuck-on, waxy, tan-brown papules and/or plaques  - Benign-appearing - Discussed benign etiology and prognosis. - Observe - Call for any changes  Melanocytic Nevi - Tan-brown and/or pink-flesh-colored symmetric macules and papules - Benign appearing on exam today - Observation - Call clinic for new or changing moles - Recommend daily use of broad spectrum spf 30+ sunscreen to sun-exposed areas.   Hemangiomas - Red papules - Discussed benign nature - Observe - Call for any changes  Actinic Damage - Chronic condition, secondary to cumulative UV/sun exposure - diffuse scaly erythematous macules with underlying dyspigmentation - Recommend daily broad spectrum sunscreen SPF 30+ to sun-exposed areas, reapply every 2 hours as needed.  - Staying in the shade or wearing long sleeves, sun glasses (UVA+UVB protection) and wide brim hats (4-inch brim around the entire circumference of the hat) are also recommended for sun protection.  - Call for new or changing lesions.  Skin cancer screening performed today.  Return for surgery - cyst of R axilla.  Luther Redo, CMA, am acting as scribe for Sarina Ser, MD . Documentation: I have reviewed the above documentation for accuracy and completeness, and I agree with the above.  Sarina Ser, MD

## 2021-07-08 ENCOUNTER — Other Ambulatory Visit: Payer: Self-pay

## 2021-07-08 MED ORDER — TRETINOIN 0.025 % EX CREA
TOPICAL_CREAM | Freq: Every day | CUTANEOUS | 2 refills | Status: AC
Start: 2021-07-08 — End: 2022-07-08

## 2021-08-12 ENCOUNTER — Encounter: Payer: Medicare Other | Admitting: Dermatology

## 2021-08-19 ENCOUNTER — Ambulatory Visit: Payer: Medicare Other | Admitting: Dermatology

## 2021-09-12 IMAGING — MG MM DIGITAL SCREENING BILAT W/ TOMO AND CAD
8 series · 8 of 24 positions shown · non-contrast
Comparison: Previous exam(s).

CLINICAL DATA: Screening.

EXAM:
DIGITAL SCREENING BILATERAL MAMMOGRAM WITH TOMOSYNTHESIS AND CAD
TECHNIQUE: Bilateral screening digital craniocaudal and mediolateral oblique
mammograms were obtained. Bilateral screening digital breast
tomosynthesis was performed. The images were evaluated with
computer-aided detection.

[L CC synth-2D]
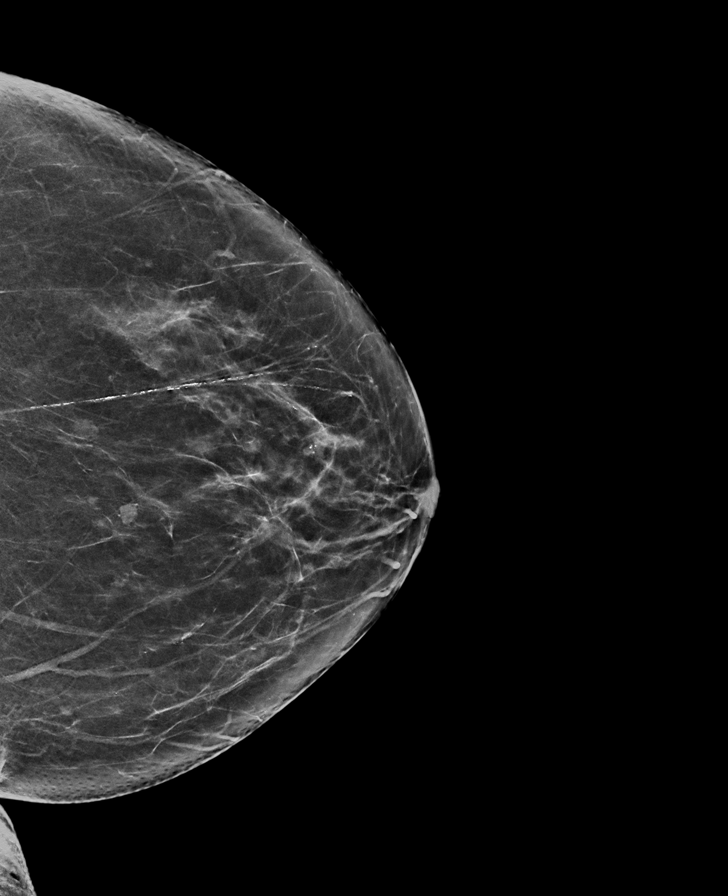

[R CC synth-2D]
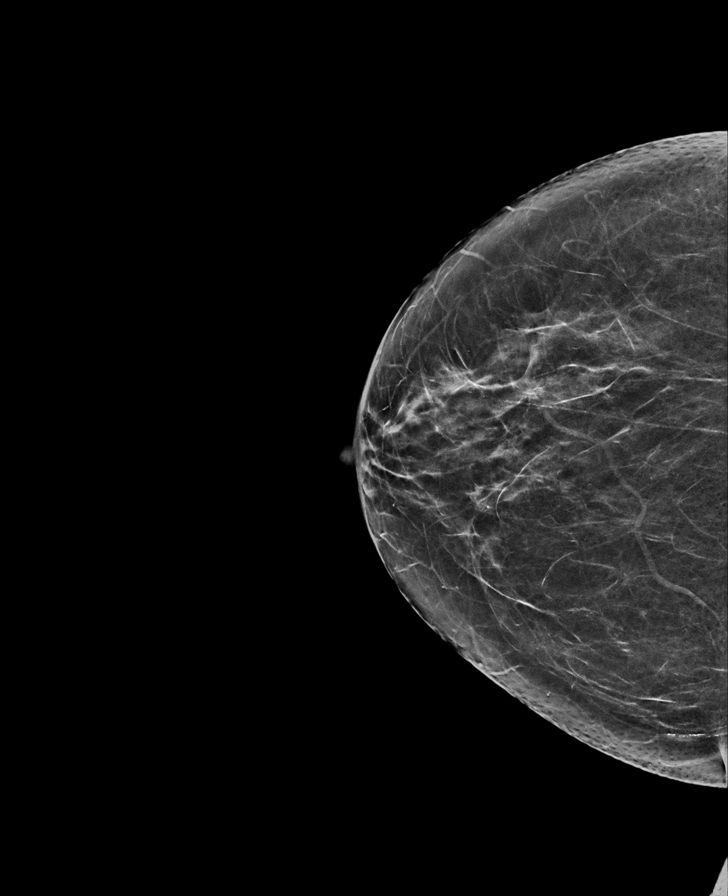

[L MLO synth-2D]
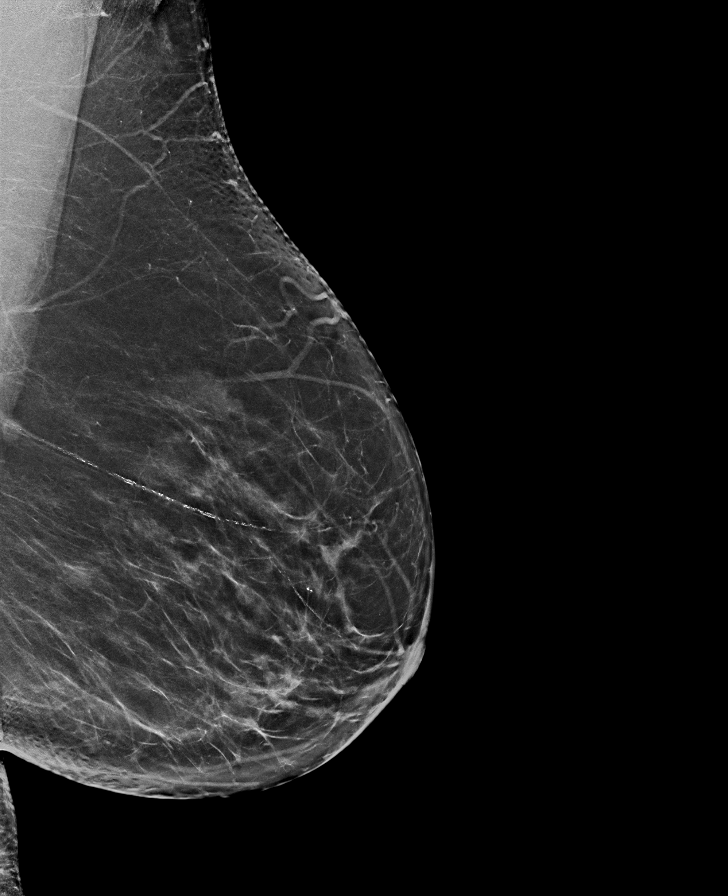

[R MLO synth-2D]
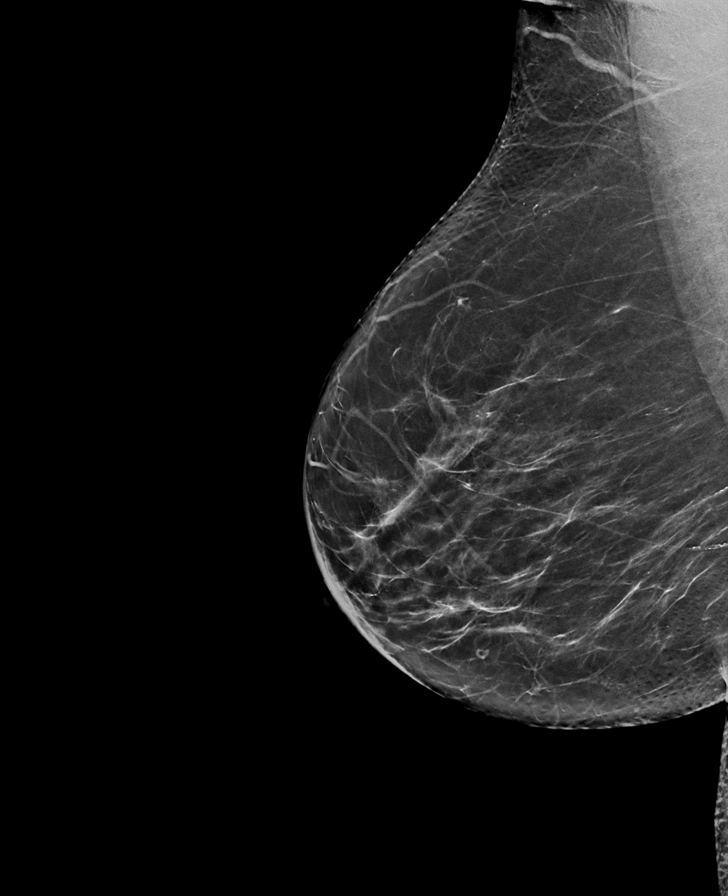

[L CC tomo · tomo slice 35/68.0]
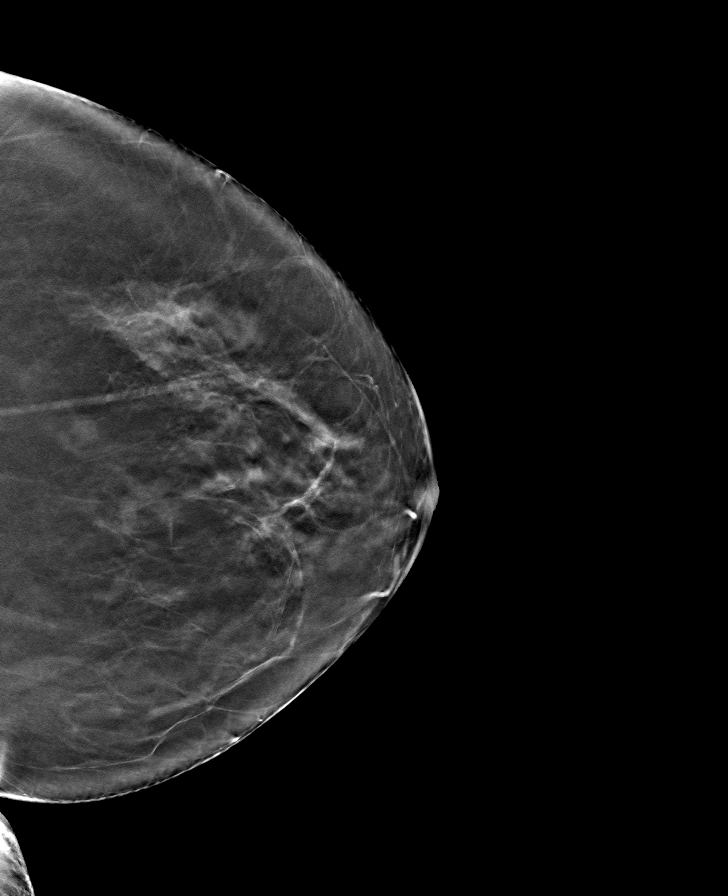

[R CC tomo · tomo slice 33/64.0]
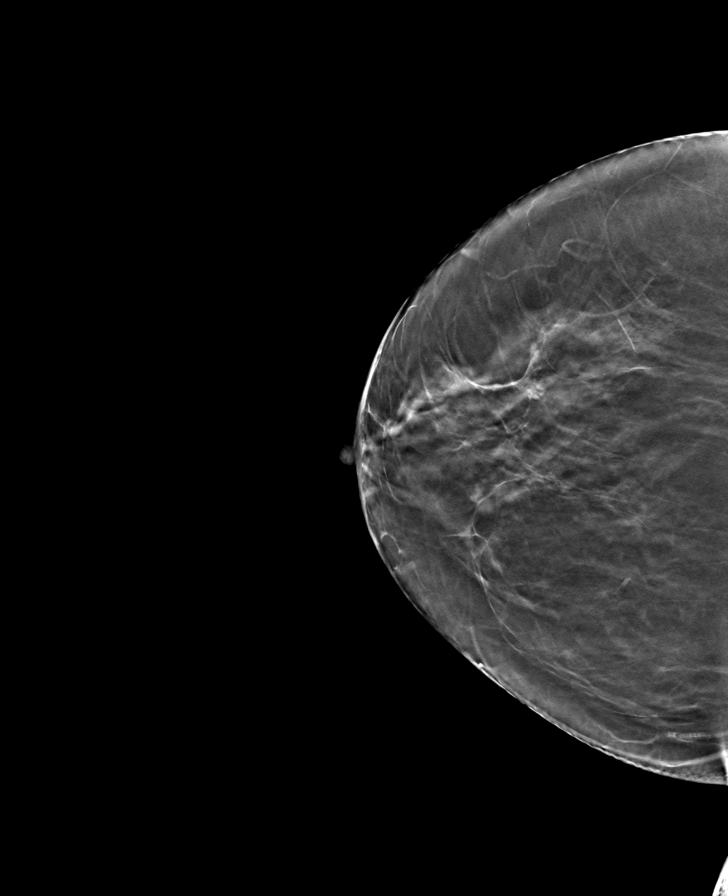

[L MLO tomo · tomo slice 37/74.0]
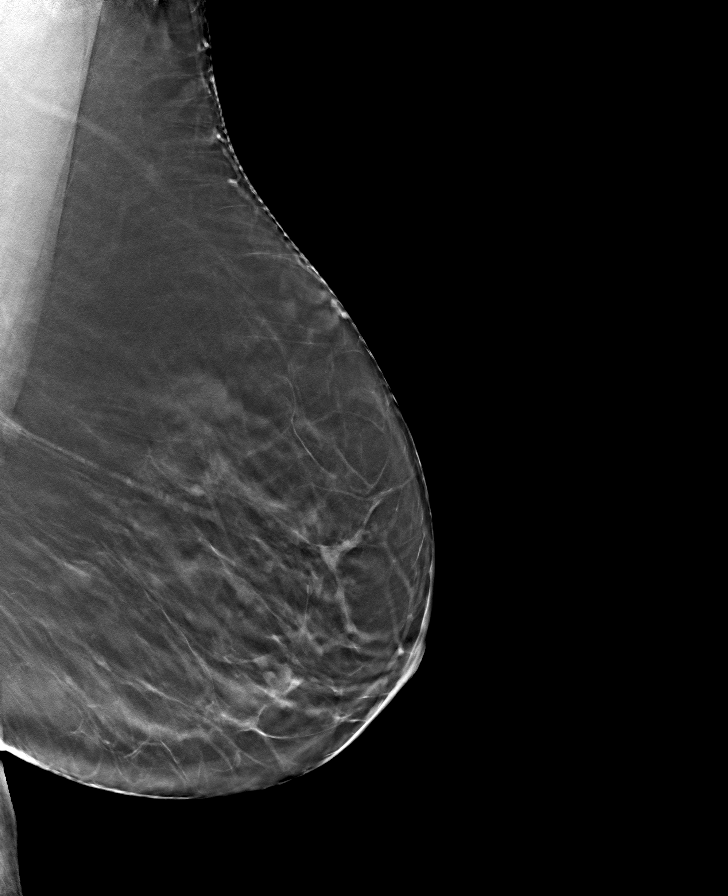

[R MLO tomo · tomo slice 36/71.0]
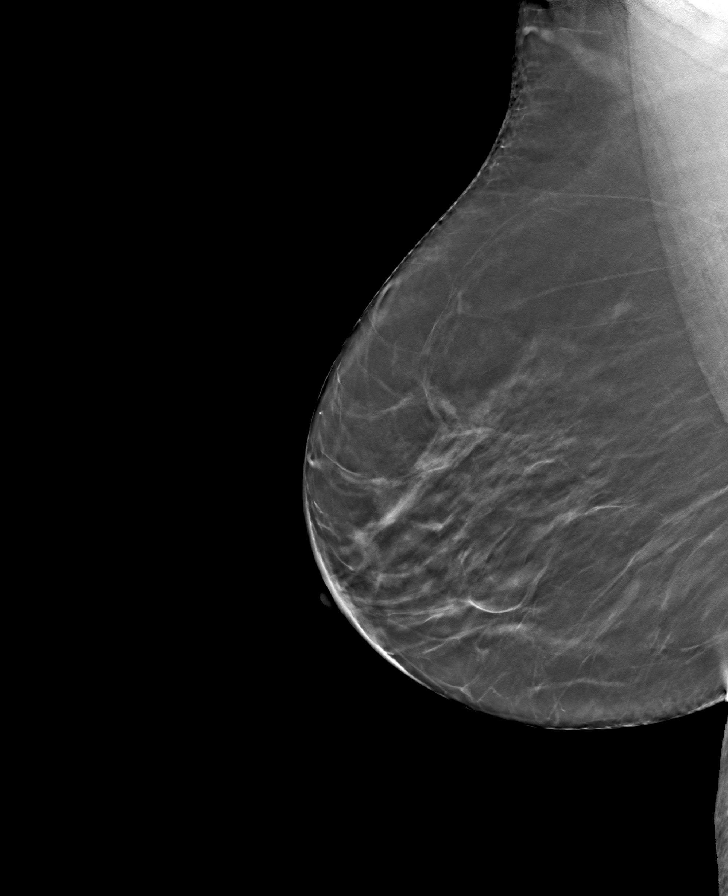

[8 of 24 positions shown; findings below may reference images not displayed]

ACR Breast Density Category b: There are scattered areas of
fibroglandular density.
FINDINGS: There are no findings suspicious for malignancy.
IMPRESSION: No mammographic evidence of malignancy. A result letter of this
screening mammogram will be mailed directly to the patient.

RECOMMENDATION:
Screening mammogram in one year. (Code:51-O-LD2)

BI-RADS CATEGORY  1: Negative.

## 2021-12-03 ENCOUNTER — Other Ambulatory Visit: Payer: Self-pay | Admitting: Family Medicine

## 2021-12-03 DIAGNOSIS — Z1231 Encounter for screening mammogram for malignant neoplasm of breast: Secondary | ICD-10-CM

## 2021-12-08 DIAGNOSIS — Z01818 Encounter for other preprocedural examination: Secondary | ICD-10-CM | POA: Insufficient documentation

## 2021-12-24 DIAGNOSIS — F411 Generalized anxiety disorder: Secondary | ICD-10-CM | POA: Insufficient documentation

## 2022-01-21 DIAGNOSIS — Z902 Acquired absence of lung [part of]: Secondary | ICD-10-CM | POA: Insufficient documentation

## 2022-01-26 ENCOUNTER — Ambulatory Visit: Payer: Medicare Other

## 2022-02-27 DIAGNOSIS — R5383 Other fatigue: Secondary | ICD-10-CM | POA: Insufficient documentation

## 2022-02-27 DIAGNOSIS — R0609 Other forms of dyspnea: Secondary | ICD-10-CM | POA: Insufficient documentation

## 2022-03-07 DIAGNOSIS — I2693 Single subsegmental pulmonary embolism without acute cor pulmonale: Secondary | ICD-10-CM | POA: Insufficient documentation

## 2022-05-05 ENCOUNTER — Other Ambulatory Visit: Payer: Self-pay | Admitting: Family Medicine

## 2022-05-05 DIAGNOSIS — Z780 Asymptomatic menopausal state: Secondary | ICD-10-CM

## 2022-06-11 ENCOUNTER — Ambulatory Visit: Payer: Medicare Other

## 2022-07-01 ENCOUNTER — Ambulatory Visit: Payer: Medicare Other

## 2022-07-14 ENCOUNTER — Ambulatory Visit: Payer: Medicare Other

## 2022-08-06 ENCOUNTER — Ambulatory Visit: Payer: Medicare Other

## 2022-08-12 ENCOUNTER — Ambulatory Visit
Admission: RE | Admit: 2022-08-12 | Discharge: 2022-08-12 | Disposition: A | Discharge status: Home / Self Care | Payer: Medicare Other | Source: Ambulatory Visit | Attending: Family Medicine | Admitting: Family Medicine

## 2022-08-12 DIAGNOSIS — Z1231 Encounter for screening mammogram for malignant neoplasm of breast: Secondary | ICD-10-CM | POA: Insufficient documentation

## 2022-10-06 ENCOUNTER — Ambulatory Visit (INDEPENDENT_AMBULATORY_CARE_PROVIDER_SITE_OTHER): Payer: Medicare Other

## 2022-10-06 ENCOUNTER — Ambulatory Visit
Admission: EM | Admit: 2022-10-06 | Discharge: 2022-10-06 | Disposition: A | Discharge status: Home / Self Care | Payer: Medicare Other | Attending: Family Medicine | Admitting: Family Medicine

## 2022-10-06 DIAGNOSIS — M25512 Pain in left shoulder: Secondary | ICD-10-CM | POA: Diagnosis present

## 2022-10-06 DIAGNOSIS — N3000 Acute cystitis without hematuria: Secondary | ICD-10-CM | POA: Diagnosis present

## 2022-10-06 DIAGNOSIS — D321 Benign neoplasm of spinal meninges: Secondary | ICD-10-CM | POA: Insufficient documentation

## 2022-10-06 LAB — CBC WITH DIFFERENTIAL/PLATELET
Abs Immature Granulocytes: 0.03 10*3/uL (ref 0.00–0.07)
Basophils Absolute: 0 10*3/uL (ref 0.0–0.1)
Basophils Relative: 0 %
Eosinophils Absolute: 0.2 10*3/uL (ref 0.0–0.5)
Eosinophils Relative: 3 %
HCT: 40.7 % (ref 36.0–46.0)
Hemoglobin: 13.3 g/dL (ref 12.0–15.0)
Immature Granulocytes: 0 %
Lymphocytes Relative: 26 %
Lymphs Abs: 2 10*3/uL (ref 0.7–4.0)
MCH: 26.7 pg (ref 26.0–34.0)
MCHC: 32.7 g/dL (ref 30.0–36.0)
MCV: 81.7 fL (ref 80.0–100.0)
Monocytes Absolute: 0.6 10*3/uL (ref 0.1–1.0)
Monocytes Relative: 8 %
Neutro Abs: 4.7 10*3/uL (ref 1.7–7.7)
Neutrophils Relative %: 63 %
Platelets: 317 10*3/uL (ref 150–400)
RBC: 4.98 MIL/uL (ref 3.87–5.11)
RDW: 14.2 % (ref 11.5–15.5)
WBC: 7.6 10*3/uL (ref 4.0–10.5)
nRBC: 0 % (ref 0.0–0.2)

## 2022-10-06 LAB — URINALYSIS, ROUTINE W REFLEX MICROSCOPIC
Bilirubin Urine: NEGATIVE
Glucose, UA: NEGATIVE mg/dL
Hgb urine dipstick: NEGATIVE
Ketones, ur: NEGATIVE mg/dL
Nitrite: NEGATIVE
Protein, ur: NEGATIVE mg/dL
Specific Gravity, Urine: <1.005 — ABNORMAL LOW (ref 1.005–1.030)
pH: 5.5 (ref 5.0–8.0)

## 2022-10-06 LAB — BASIC METABOLIC PANEL
Anion gap: 5 (ref 5–15)
BUN: 18 mg/dL (ref 8–23)
CO2: 25 mmol/L (ref 22–32)
Calcium: 9.2 mg/dL (ref 8.9–10.3)
Chloride: 107 mmol/L (ref 98–111)
Creatinine, Ser: 0.94 mg/dL (ref 0.44–1.00)
GFR, Estimated: >60 mL/min (ref >60)
Glucose, Bld: 96 mg/dL (ref 70–99)
Potassium: 4.3 mmol/L (ref 3.5–5.1)
Sodium: 137 mmol/L (ref 135–145)

## 2022-10-06 LAB — URINALYSIS, MICROSCOPIC (REFLEX): WBC, UA: >50 WBC/hpf (ref 0–5)

## 2022-10-06 MED ORDER — CEPHALEXIN 500 MG PO CAPS: 500.0000 mg | ORAL_CAPSULE | Freq: Four times a day (QID) | ORAL | 0 refills | Status: DC

## 2022-10-06 NOTE — ED Triage Notes (Signed)
Patient presents to UC for back pain, fatigue, dark urine, odor x 2 weeks. States she was seen at her PCP 10/11 and treated with antibiotics 5 days after with some improvement.

## 2022-10-06 NOTE — Discharge Instructions (Addendum)
You have a urinary tract infection. I sent your urine for culture to be sure the antibiotics given will treat your infection.  If we need to change your antibiotics, you will be contacted.   Your x-ray showed some arthritis.  For your shoulder pain, you were given a injection for pain.  Do not take over the counter NSAIDs as you are taking Xarelto.  You can take Tylenol 1000 mg  three times a day.  Follow up with you orthopedic provider, if pain does not improve in the next 1-2 weeks.

## 2022-10-06 NOTE — ED Provider Notes (Signed)
[facility]    CSN: [id number] Arrival date & time: 10/06/22  1557      History   Chief Complaint Chief Complaint  Patient presents with   Back Pain   Fatigue     HPI HPI Carly Lopez is a 71 y.o. female.   Carly Lopez presents for ongoing urinary frequency and urgency with dysuria for the past month.  He was treated in October for urinary tract infection and feels like her symptoms did not go away.  Her malodorous and cloudy urine have returned.  Has lower abdominal and back pain with some nausea.  Denies any hematuria, fever or chills.  Has been no new vaginal discharge.  She had a hysterectomy.  Has not been lifting anything heavy.  Has history of kidney stones over 10 years ago.  She also reports left shoulder pain that started today.  She has history of arthritis.  Feels like she cannot lift her shoulder as much as she used to.  She does not have diabetes but was told in the past she may be prediabetic.        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    Patient Active Problem List   Diagnosis Date Noted   Spinal meningioma ([place]) 10/06/2022   Single subsegmental pulmonary embolism without acute cor pulmonale ([place]) 03/07/2022   DOE (dyspnea on exertion) 02/27/2022   Fatigue 02/27/2022   S/P lobectomy of lung 01/21/2022   Generalized anxiety disorder 12/24/2021   Preoperative evaluation of a medical condition to rule out surgical contraindications (TAR required) 12/08/2021   Former tobacco use 06/09/2021   Stage 3a chronic kidney disease ([place]) 04/18/2021   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   Primary insomnia 01/05/2021   Aftercare following joint replacement 06/23/2020   Presence of right artificial knee joint 06/20/2020   Essential hypertension 12/01/2019   Major depressive disorder with single  episode, 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   Migraine with vertigo 09/25/2019   History of TIA (transient ischemic attack) 04/04/2019   Smoking 01/04/2019   Transient ischemic attack 12/29/2018   Bochdalek hernia 03/24/2018   OA (osteoarthritis) of hip 07/13/2014   Hypercholesterolemia 10/09/2011   Irritable bowel syndrome with both constipation and diarrhea 10/09/2011   Gastroesophageal reflux disease without esophagitis 06/25/2011   History of diverticulitis of colon 06/25/2011   Osteopenia 06/25/2011    Past Surgical History:  Procedure Laterality Date   ABDOMINAL HYSTERECTOMY     CHOLECYSTECTOMY     TONSILLECTOMY      OB History   No obstetric history on file.      Home Medications    Prior to Admission medications   Medication Sig Start Date End Date Taking? Authorizing Provider  cephALEXin (KEFLEX) 500 MG capsule Take 1 capsule (500 mg total) by mouth 4 (four) times daily. 10/06/22  Yes Eino Whitner, DO  famotidine (PEPCID) 40 MG tablet Take 1 tablet every day by oral route. 10/03/21  Yes [provider]  aspirin 81 MG EC tablet aspirin 81 mg tablet  Take by oral route.    [provider]  atorvastatin (LIPITOR) 20 MG tablet 20 mg DAILY (route: oral) 06/23/20   [provider]  buPROPion (WELLBUTRIN SR) 150 MG 12 hr tablet 1 tablet DAILY (route: oral) 05/30/20   [provider]  Cholecalciferol 50  MCG (2000 UT) CAPS Take by mouth.    [provider]  hydrochlorothiazide (HYDRODIURIL) 12.5 MG tablet Take 12.5 mg by mouth daily. 04/15/21   [provider]  lisinopril (ZESTRIL) 20 MG tablet 1 tablet DAILY (route: oral) 06/23/20   [provider]  pantoprazole (PROTONIX) 40 MG tablet 40 mg DAILY (route: oral) 06/23/20   [provider]  Rivaroxaban (XARELTO) 15 MG TABS tablet TAKE 1 TABLET BY MOUTH TWICE DAILY FOR 21 DAYS    [provider]  sertraline (ZOLOFT) 25 MG tablet Take 25 mg by mouth  daily. 04/15/21   [provider]  sucralfate (CARAFATE) 1 G tablet Take 1 tablet (1 g total) by mouth 4 (four) times daily. 11/12/15   Nance Pear, MD    Family History Family History  Problem Relation Age of Onset   Breast cancer Maternal Aunt     Social History Social History   Tobacco Use   Smoking status: Every Day  Vaping Use   Vaping Use: Never used  Substance Use Topics   Alcohol use: No   Drug use: Never     Allergies   Aleve [naproxen], Atorvastatin, Naproxen sodium, Levaquin [levofloxacin in d5w], Rosuvastatin, and Lovastatin   Review of Systems Review of Systems: :negative unless otherwise stated in HPI.      Physical Exam Triage Vital Signs ED Triage Vitals [10/06/22 1628]  Enc Vitals Group     BP 123/69     Pulse Rate 64     Resp 16     Temp 98.4 F (36.9 C)     Temp Source Oral     SpO2 96 %     Weight      Height      Head Circumference      Peak Flow      Pain Score      Pain Loc      Pain Edu?      Excl. in [place]?    No data found.  Updated Vital Signs BP 123/69 (BP Location: Right Arm)   Pulse 64   Temp 98.4 F (36.9 C) (Oral)   Resp 16   SpO2 96%   Visual Acuity Right Eye Distance:   Left Eye Distance:   Bilateral Distance:    Right Eye Near:   Left Eye Near:    Bilateral Near:     Physical Exam GEN: well appearing female in no acute distress  CVS: well perfused  RESP: speaking in full sentences without pause  ABD: soft, non-tender, non-distended, no palpable masses, left CVA tenderness MSK:  Left shoulder:  No evidence of bony deformity, asymmetry, or significant muscle atrophy compared to right; No tenderness over long head of biceps (bicipital groove). No TTP at [facility] joint. Limited abduction, full flexion and extension.  No  abnormal scapular function observed. Sensation intact. Peripheral pulses intact.    UC Treatments / Results  Labs (all labs ordered are listed, but only abnormal results are  displayed) Labs Reviewed  URINALYSIS, ROUTINE W REFLEX MICROSCOPIC - Abnormal; Notable for the following components:      Result Value   APPearance HAZY (*)    Specific Gravity, Urine <1.005 (*)    Leukocytes,Ua MODERATE (*)    All other components within normal limits  URINALYSIS, MICROSCOPIC (REFLEX) - Abnormal; Notable for the following components:   Bacteria, UA MANY (*)    Non Squamous Epithelial PRESENT (*)    All other components within normal limits  URINE CULTURE  CBC WITH DIFFERENTIAL/PLATELET  BASIC METABOLIC PANEL    EKG   Radiology DG Shoulder Left  Result Date: 10/06/2022 CLINICAL DATA:  Left shoulder pain, no known injury. EXAM: LEFT SHOULDER - 2+ VIEW COMPARISON:  None Available. FINDINGS: There is no evidence of fracture or dislocation. Mild acromioclavicular degenerative change. Soft tissues are unremarkable. Visualized lung field is clear. Aortic atherosclerosis. IMPRESSION: Mild acromioclavicular degenerative change. Electronically Signed   By: Dahlia Bailiff M.D.   On: 10/06/2022 17:54    Procedures Procedures (including critical care time)  Medications Ordered in UC Medications - No data to display  Initial Impression / Assessment and Plan / UC Course  I have reviewed the triage vital signs and the nursing notes.  Pertinent labs & imaging results that were available during my care of the patient were reviewed by me and considered in my medical decision making (see chart for details).      Patient is a 71 y.o. female  who presents for ongoing dysuria for the past 2-3 weeks.  Overall patient is well-appearing and afebrile.  Vital signs stable.  UA consistent with acute cystitis  Hematuria not supported on microscopy.  Treat with Keflex 4 times daily for 5 days.  CBC without leukocytosis.  Serum creatinine is stable.  Doubt pyelonephritis at this time.  Urine culture obtained.  Follow-up sensitivities and change antibiotics, if needed. Return precautions  including abdominal pain, fever, chills, nausea, or vomiting given.   For her left shoulder pain, xrays obtained were without dislocation or fracture. She did have some mild arthritis notes by radiologist. She takes Xarelto for recent PE. Avoiding use of NSAIDs at this time. Recommended Tylenol for pain TID PRN.    Discussed MDM, treatment plan and plan for follow-up with patient who agrees with plan.      Final Clinical Impressions(s) / UC Diagnoses   Final diagnoses:  Acute cystitis without hematuria  Acute pain of left shoulder     Discharge Instructions      You have a urinary tract infection. I sent your urine for culture to be sure the antibiotics given will treat your infection.  If we need to change your antibiotics, you will be contacted.   Your x-ray showed some arthritis.  For your shoulder pain, you were given a injection for pain.  Do not take over the counter NSAIDs as you are taking Xarelto.  You can take Tylenol 1000 mg  three times a day.  Follow up with you orthopedic provider, if pain does not improve in the next 1-2 weeks.      ED Prescriptions     Medication Sig Dispense Auth. Provider   cephALEXin (KEFLEX) 500 MG capsule Take 1 capsule (500 mg total) by mouth 4 (four) times daily. 20 capsule Lyndee Hensen, DO      PDMP not reviewed this encounter.   Lyndee Hensen, DO 10/06/22 2145

## 2022-10-08 LAB — URINE CULTURE: Culture: >=100000 — AB

## 2022-10-12 ENCOUNTER — Telehealth: Payer: Self-pay | Admitting: Emergency Medicine

## 2022-10-12 DIAGNOSIS — B379 Candidiasis, unspecified: Secondary | ICD-10-CM

## 2022-10-12 MED ORDER — FLUCONAZOLE 150 MG PO TABS: 150.0000 mg | ORAL_TABLET | Freq: Every day | ORAL | 1 refills | Status: DC

## 2022-10-12 NOTE — Telephone Encounter (Signed)
Pt was seen 10/06/22 dx with a UTI. She is displaying symptoms of a yeast infection which typically occurs when she takes an antibiotics. Spoke with Sunday Spillers PA ok to send in diflucan. The script has been sent to [facility] in [place].

## 2022-10-13 ENCOUNTER — Ambulatory Visit
Admission: RE | Admit: 2022-10-13 | Discharge: 2022-10-13 | Disposition: A | Discharge status: Home / Self Care | Payer: Medicare Other | Source: Ambulatory Visit | Attending: Family Medicine | Admitting: Family Medicine

## 2022-10-13 VITALS — BP 105/69 | HR 71 | Temp 98.5°F | Resp 16

## 2022-10-13 DIAGNOSIS — B3789 Other sites of candidiasis: Secondary | ICD-10-CM | POA: Insufficient documentation

## 2022-10-13 DIAGNOSIS — R42 Dizziness and giddiness: Secondary | ICD-10-CM | POA: Insufficient documentation

## 2022-10-13 DIAGNOSIS — R300 Dysuria: Secondary | ICD-10-CM | POA: Insufficient documentation

## 2022-10-13 DIAGNOSIS — Z1152 Encounter for screening for COVID-19: Secondary | ICD-10-CM | POA: Insufficient documentation

## 2022-10-13 DIAGNOSIS — R0602 Shortness of breath: Secondary | ICD-10-CM | POA: Diagnosis not present

## 2022-10-13 LAB — WET PREP, GENITAL
Clue Cells Wet Prep HPF POC: NONE SEEN
Sperm: NONE SEEN
Trich, Wet Prep: NONE SEEN
WBC, Wet Prep HPF POC: >10 — AB (ref <10)

## 2022-10-13 LAB — URINALYSIS, ROUTINE W REFLEX MICROSCOPIC
Bilirubin Urine: NEGATIVE
Glucose, UA: NEGATIVE mg/dL
Hgb urine dipstick: NEGATIVE
Ketones, ur: NEGATIVE mg/dL
Leukocytes,Ua: NEGATIVE
Nitrite: NEGATIVE
Protein, ur: NEGATIVE mg/dL
Specific Gravity, Urine: 1.01 (ref 1.005–1.030)
pH: 5.5 (ref 5.0–8.0)

## 2022-10-13 LAB — RESP PANEL BY RT-PCR (FLU A&B, COVID) ARPGX2
Influenza A by PCR: NEGATIVE
Influenza B by PCR: NEGATIVE
SARS Coronavirus 2 by RT PCR: NEGATIVE

## 2022-10-13 NOTE — Discharge Instructions (Addendum)
Follow up here or with your primary care provider if your symptoms are worsening or not improving.     

## 2022-10-13 NOTE — ED Triage Notes (Signed)
Patient presents to UC for fatigue, chills, dizziness, SOB since sat. Urinary freq, cloudy urine, vaginal burning since last vist. States she is taking the antibiotics with no improvement.   Denies fever.

## 2022-10-13 NOTE — ED Provider Notes (Signed)
[facility]    CSN: [id number] Arrival date & time: 10/13/22  1459      History   Chief Complaint Chief Complaint  Patient presents with   Abdominal Pain    Dizziness, fatigue, urinary issues,chills - Entered by patient   Dizziness   Fatigue   Chills   Shortness of Breath   Urinary Frequency   Dysuria    HPI Tawanna Funk is a 71 y.o. female.    Abdominal Pain Associated symptoms: dysuria and shortness of breath   Dizziness Associated symptoms: shortness of breath   Shortness of Breath Associated symptoms: abdominal pain   Urinary Frequency Associated symptoms include abdominal pain and shortness of breath.  Dysuria Associated symptoms: abdominal pain     This to UC for complaint of fatigue, chills, dizziness, shortness of breath since Saturday.    Denies fever but states she is "clammy".  Recently seen for urinary frequency, cloudy urine, vaginal burning.  Was prescribed cephalexin and reports improvement.  Prescription sent in today for Diflucan yesterday but she has not yet picked it up.  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    Patient Active Problem List   Diagnosis Date Noted   Spinal meningioma ([place]) 10/06/2022   Single subsegmental pulmonary embolism without acute cor pulmonale ([place]) 03/07/2022   DOE (dyspnea on exertion) 02/27/2022   Fatigue 02/27/2022   S/P lobectomy of lung 01/21/2022   Generalized anxiety disorder 12/24/2021   Preoperative evaluation of a medical condition to rule out surgical contraindications (TAR required) 12/08/2021   Former tobacco use 06/09/2021   Stage 3a chronic kidney disease ([place]) 04/18/2021   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   Primary insomnia 01/05/2021   Aftercare following joint replacement 06/23/2020   Presence of right artificial  knee joint 06/20/2020   Essential hypertension 12/01/2019   Major depressive disorder with single episode, in partial remission ([place]) 12/01/2019   Migraine with vertigo 09/25/2019   History of TIA (transient ischemic attack) 04/04/2019   Smoking 01/04/2019   Transient ischemic attack 12/29/2018   Bochdalek hernia 03/24/2018   OA (osteoarthritis) of hip 07/13/2014   Hypercholesterolemia 10/09/2011   Irritable bowel syndrome with both constipation and diarrhea 10/09/2011   Gastroesophageal reflux disease without esophagitis 06/25/2011   History of diverticulitis of colon 06/25/2011   Osteopenia 06/25/2011    Past Surgical History:  Procedure Laterality Date   ABDOMINAL HYSTERECTOMY     CHOLECYSTECTOMY     TONSILLECTOMY      OB History   No obstetric history on file.      Home Medications    Prior to Admission medications   Medication Sig Start Date End Date Taking? Authorizing Provider  aspirin 81 MG EC tablet aspirin 81 mg tablet  Take by oral route.    [provider]  atorvastatin (LIPITOR) 20 MG tablet 20 mg DAILY (route: oral) 06/23/20   [provider]  buPROPion (WELLBUTRIN SR) 150 MG 12 hr tablet 1 tablet DAILY (route: oral) 05/30/20   [provider]  cephALEXin (KEFLEX) 500 MG capsule Take 1 capsule (500 mg total) by mouth 4 (four) times daily. 10/06/22   Lyndee Hensen, DO  Cholecalciferol 50 MCG (2000 UT) CAPS Take by mouth.    [provider]  famotidine (PEPCID) 40 MG tablet Take 1 tablet every day by  oral route. 10/03/21   [provider]  fluconazole (DIFLUCAN) 150 MG tablet Take 1 tablet (150 mg total) by mouth daily. 10/12/22   Rodriguez-[place], Sunday Spillers, PA-C  hydrochlorothiazide (HYDRODIURIL) 12.5 MG tablet Take 12.5 mg by mouth daily. 04/15/21   [provider]  lisinopril (ZESTRIL) 20 MG tablet 1 tablet DAILY (route: oral) 06/23/20   [provider]  pantoprazole (PROTONIX) 40 MG tablet 40 mg  DAILY (route: oral) 06/23/20   [provider]  Rivaroxaban (XARELTO) 15 MG TABS tablet TAKE 1 TABLET BY MOUTH TWICE DAILY FOR 21 DAYS    [provider]  sertraline (ZOLOFT) 25 MG tablet Take 25 mg by mouth daily. 04/15/21   [provider]  sucralfate (CARAFATE) 1 G tablet Take 1 tablet (1 g total) by mouth 4 (four) times daily. 11/12/15   Nance Pear, MD    Family History Family History  Problem Relation Age of Onset   Breast cancer Maternal Aunt     Social History Social History   Tobacco Use   Smoking status: Every Day  Vaping Use   Vaping Use: Never used  Substance Use Topics   Alcohol use: No   Drug use: Never     Allergies   Aleve [naproxen], Atorvastatin, Naproxen sodium, Levaquin [levofloxacin in d5w], Rosuvastatin, and Lovastatin   Review of Systems Review of Systems  Respiratory:  Positive for shortness of breath.   Gastrointestinal:  Positive for abdominal pain.  Genitourinary:  Positive for dysuria and frequency.  Neurological:  Positive for dizziness.    Physical Exam Triage Vital Signs ED Triage Vitals [10/13/22 1531]  Enc Vitals Group     BP 105/69     Pulse Rate 71     Resp 16     Temp 98.5 F (36.9 C)     Temp Source Oral     SpO2 95 %     Weight      Height      Head Circumference      Peak Flow      Pain Score      Pain Loc      Pain Edu?      Excl. in [place]?    No data found.  Updated Vital Signs BP 105/69 (BP Location: Left Arm)   Pulse 71   Temp 98.5 F (36.9 C) (Oral)   Resp 16   SpO2 95%   Visual Acuity Right Eye Distance:   Left Eye Distance:   Bilateral Distance:    Right Eye Near:   Left Eye Near:    Bilateral Near:     Physical Exam Vitals reviewed.  Constitutional:      Appearance: She is well-developed.  Cardiovascular:     Rate and Rhythm: Normal rate and regular rhythm.  Pulmonary:     Effort: Pulmonary effort is normal.     Breath sounds: Normal breath sounds.  Abdominal:      General: Bowel sounds are normal.     Palpations: Abdomen is soft.     Tenderness: There is abdominal tenderness in the left lower quadrant.  Skin:    General: Skin is warm and dry.  Neurological:     Mental Status: She is alert.      UC Treatments / Results  Labs (all labs ordered are listed, but only abnormal results are displayed) Labs Reviewed  WET PREP, GENITAL  RESP PANEL BY RT-PCR (FLU A&B, COVID) [id number]  URINE CULTURE  URINALYSIS, ROUTINE W REFLEX MICROSCOPIC  EKG   Radiology No results found.  Procedures Procedures (including critical care time)  Medications Ordered in UC Medications - No data to display  Initial Impression / Assessment and Plan / UC Course  I have reviewed the triage vital signs and the nursing notes.  Pertinent labs & imaging results that were available during my care of the patient were reviewed by me and considered in my medical decision making (see chart for details).   UA is negative for signs of infection.  Wet prep shows Candida infection.  Patient was treated with Diflucan yesterday but has not yet picked up prescription.  EKG is reassuring with normal sinus rhythm.  Unclear etiology for her symptoms.  Suspect viral pathology and recommending patient get rest and fluids.  Follow-up with your primary care provider.   Final Clinical Impressions(s) / UC Diagnoses   Final diagnoses:  Dysuria   Discharge Instructions   None    ED Prescriptions   None    PDMP not reviewed this encounter.   Rose Phi, [place] 10/13/22 1629

## 2022-10-15 LAB — URINE CULTURE

## 2023-02-18 ENCOUNTER — Other Ambulatory Visit: Payer: Self-pay

## 2023-02-18 ENCOUNTER — Ambulatory Visit
Admission: EM | Admit: 2023-02-18 | Discharge: 2023-02-18 | Disposition: A | Discharge status: Home / Self Care | Payer: Medicare Other | Attending: Physician Assistant | Admitting: Physician Assistant

## 2023-02-18 DIAGNOSIS — N3000 Acute cystitis without hematuria: Secondary | ICD-10-CM | POA: Insufficient documentation

## 2023-02-18 DIAGNOSIS — R350 Frequency of micturition: Secondary | ICD-10-CM | POA: Insufficient documentation

## 2023-02-18 DIAGNOSIS — R32 Unspecified urinary incontinence: Secondary | ICD-10-CM | POA: Diagnosis present

## 2023-02-18 DIAGNOSIS — R5383 Other fatigue: Secondary | ICD-10-CM

## 2023-02-18 DIAGNOSIS — Z20822 Contact with and (suspected) exposure to covid-19: Secondary | ICD-10-CM | POA: Insufficient documentation

## 2023-02-18 LAB — URINALYSIS, W/ REFLEX TO CULTURE (INFECTION SUSPECTED)
Bilirubin Urine: NEGATIVE
Glucose, UA: NEGATIVE mg/dL
Hgb urine dipstick: NEGATIVE
Ketones, ur: NEGATIVE mg/dL
Nitrite: NEGATIVE
Protein, ur: NEGATIVE mg/dL
Specific Gravity, Urine: 1.01 (ref 1.005–1.030)
pH: 5.5 (ref 5.0–8.0)

## 2023-02-18 LAB — SARS CORONAVIRUS 2 BY RT PCR: SARS Coronavirus 2 by RT PCR: NEGATIVE

## 2023-02-18 MED ORDER — NITROFURANTOIN MONOHYD MACRO 100 MG PO CAPS: 100.0000 mg | ORAL_CAPSULE | Freq: Two times a day (BID) | ORAL | 0 refills | Status: DC

## 2023-02-18 MED ORDER — SULFAMETHOXAZOLE-TRIMETHOPRIM 800-160 MG PO TABS: 1.0000 | ORAL_TABLET | Freq: Two times a day (BID) | ORAL | Status: DC

## 2023-02-18 MED ORDER — SULFAMETHOXAZOLE-TRIMETHOPRIM 800-160 MG PO TABS: 1.0000 | ORAL_TABLET | Freq: Two times a day (BID) | ORAL | 0 refills | Status: AC

## 2023-02-18 MED ORDER — SULFAMETHOXAZOLE-TRIMETHOPRIM 800-160 MG PO TABS
1.0000 | ORAL_TABLET | Freq: Once | ORAL | Status: AC
  Administered 2023-02-18: 1 via ORAL

## 2023-02-18 NOTE — ED Triage Notes (Signed)
Urinary frequency and fatigue since Thursday. Pt states she was exposed to [place] pos person and believes she may have COVID. Pt c/o Stuffiness, diarrhea, fatigue and weakness

## 2023-02-18 NOTE — Discharge Instructions (Addendum)
-  Negative COVID test.  -Urine obtained today appears to be consistent with UTI.  I have sent the urine for culture.  I sent antibiotics to pharmacy.  Will call if need to change antibiotic. - Increase your rest and fluids and be careful position changes especially if you feel dizzy. - If you begin to have fevers, increased or worsening fatigue or weakness, return for reevaluation or go to emergency department.  UTI: Based on either symptoms or urinalysis, you may have a urinary tract infection. We will send the urine for culture and call with results in a few days. Begin antibiotics at this time. Your symptoms should be much improved over the next 2-3 days. Increase rest and fluid intake. If for some reason symptoms are worsening or not improving after a couple of days or the urine culture determines the antibiotics you are taking will not treat the infection, the antibiotics may be changed. Return or go to ER for fever, back pain, worsening urinary pain, discharge, increased blood in urine. May take Tylenol or Motrin OTC for pain relief or consider AZO if no contraindications

## 2023-02-18 NOTE — ED Provider Notes (Signed)
[facility]    CSN: [id number] Arrival date & time: 02/18/23  1715      History   Chief Complaint Chief Complaint  Patient presents with   Urinary Frequency    Urinary frequency since Thursday    Fatigue    HPI Carly Lopez is a 72 y.o. female presenting for multiple complaints.  Patient reports that over the past 3 days she has had increased fatigue, diarrhea, nasal congestion, burning sensation in her stomach, urinary frequency/urgency and episodes of incontinence.  She denies any fever, cough, sore throat, chest pain, shortness of breath, nausea/vomiting or pain with urination.  Patient also denies flank pain.  She believes she might have a UTI.  She also reports that she was exposed to 2 different family numbers with COVID recently would like to have a COVID test.  She has not been taking any medicine over-the-counter for symptoms.  No other concerns.  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   Migraine  with vertigo 09/25/2019   History of TIA (transient ischemic attack) 04/04/2019   Smoking 01/04/2019   Transient ischemic attack 12/29/2018   Bochdalek hernia 03/24/2018   OA (osteoarthritis) of hip 07/13/2014   Hypercholesterolemia 10/09/2011   Irritable bowel syndrome with both constipation and diarrhea 10/09/2011   Gastroesophageal reflux disease without esophagitis 06/25/2011   History of diverticulitis of colon 06/25/2011   Osteopenia 06/25/2011    Past Surgical History:  Procedure Laterality Date   ABDOMINAL HYSTERECTOMY     CHOLECYSTECTOMY     TONSILLECTOMY      OB History   No obstetric history on file.      Home Medications    Prior to Admission medications   Medication Sig Start Date End Date Taking? Authorizing Provider  sulfamethoxazole-trimethoprim (BACTRIM DS) 800-160 MG tablet Take 1 tablet by mouth 2 (two) times daily for 7 days. 02/18/23 02/25/23 Yes Danton Clap, PA-C  aspirin 81 MG EC tablet aspirin 81 mg tablet  Take by oral route.    [provider]  atorvastatin (LIPITOR) 20 MG tablet 20 mg DAILY (route: oral) 06/23/20   [provider]  buPROPion (WELLBUTRIN SR) 150 MG 12 hr tablet 1 tablet DAILY (route: oral) 05/30/20   [provider]  cephALEXin (KEFLEX) 500 MG capsule Take 1 capsule (500 mg total) by mouth 4 (four) times daily. 10/06/22   Lyndee Hensen, DO  Cholecalciferol 50 MCG (2000 UT) CAPS Take by mouth.    [provider]  famotidine (PEPCID) 40 MG tablet Take 1 tablet every day by  oral route. 10/03/21   [provider]  fluconazole (DIFLUCAN) 150 MG tablet Take 1 tablet (150 mg total) by mouth daily. 10/12/22   Rodriguez-Southworth, Sunday Spillers, PA-C  hydrochlorothiazide (HYDRODIURIL) 12.5 MG tablet Take 12.5 mg by mouth daily. 04/15/21   [provider]  lisinopril (ZESTRIL) 20 MG tablet 1 tablet DAILY (route: oral) 06/23/20   [provider]  pantoprazole (PROTONIX) 40 MG tablet 40 mg  DAILY (route: oral) 06/23/20   [provider]  Rivaroxaban (XARELTO) 15 MG TABS tablet TAKE 1 TABLET BY MOUTH TWICE DAILY FOR 21 DAYS    [provider]  sertraline (ZOLOFT) 25 MG tablet Take 25 mg by mouth daily. 04/15/21   [provider]  sucralfate (CARAFATE) 1 G tablet Take 1 tablet (1 g total) by mouth 4 (four) times daily. 11/12/15   Nance Pear, MD    Family History Family History  Problem Relation Age of Onset   Breast cancer Maternal Aunt     Social History Social History   Tobacco Use   Smoking status: Every Day  Vaping Use   Vaping Use: Never used  Substance Use Topics   Alcohol use: No   Drug use: Never     Allergies   Aleve [naproxen], Atorvastatin, Naproxen sodium, Levaquin [levofloxacin in d5w], Rosuvastatin, and Lovastatin   Review of Systems Review of Systems  Constitutional:  Positive for fatigue. Negative for chills, diaphoresis and fever.  HENT:  Positive for congestion. Negative for ear pain, rhinorrhea, sinus pressure, sinus pain and sore throat.   Respiratory:  Negative for cough and shortness of breath.   Cardiovascular:  Negative for chest pain.  Gastrointestinal:  Positive for abdominal pain and diarrhea. Negative for nausea and vomiting.  Genitourinary:  Positive for frequency and urgency. Negative for decreased urine volume, dysuria, flank pain, hematuria, pelvic pain, vaginal bleeding, vaginal discharge and vaginal pain.  Musculoskeletal:  Negative for arthralgias, back pain and myalgias.  Skin:  Negative for rash.  Neurological:  Negative for weakness and headaches.  Hematological:  Negative for adenopathy.     Physical Exam Triage Vital Signs ED Triage Vitals  Enc Vitals Group     BP      Pulse      Resp      Temp      Temp src      SpO2      Weight      Height      Head Circumference      Peak Flow      Pain Score      Pain Loc      Pain Edu?      Excl. in [place]?    No data found.  Updated  Vital Signs BP 122/74   Pulse 67   Temp 98.5 F (36.9 C) (Oral)   Resp 18   SpO2 97%   Physical Exam Vitals and nursing note reviewed.  Constitutional:      General: She is not in acute distress.    Appearance: Normal appearance. She is not ill-appearing or toxic-appearing.  HENT:     Head: Normocephalic and atraumatic.     Nose: Nose normal.     Mouth/Throat:     Mouth: Mucous membranes are moist.     Pharynx: Oropharynx is clear.  Eyes:     General: No scleral icterus.       Right eye: No discharge.        Left eye: No discharge.  Conjunctiva/sclera: Conjunctivae normal.  Cardiovascular:     Rate and Rhythm: Normal rate and regular rhythm.     Heart sounds: Normal heart sounds.  Pulmonary:     Effort: Pulmonary effort is normal. No respiratory distress.     Breath sounds: Normal breath sounds.  Abdominal:     Palpations: Abdomen is soft.     Tenderness: There is abdominal tenderness (LLQ). There is no right CVA tenderness or left CVA tenderness.  Musculoskeletal:     Cervical back: Neck supple.  Skin:    General: Skin is dry.  Neurological:     General: No focal deficit present.     Mental Status: She is alert. Mental status is at baseline.     Motor: No weakness.     Gait: Gait normal.  Psychiatric:        Mood and Affect: Mood normal.        Behavior: Behavior normal.        Thought Content: Thought content normal.      UC Treatments / Results  Labs (all labs ordered are listed, but only abnormal results are displayed) Labs Reviewed  URINALYSIS, W/ REFLEX TO CULTURE (INFECTION SUSPECTED) - Abnormal; Notable for the following components:      Result Value   APPearance HAZY (*)    Leukocytes,Ua MODERATE (*)    Non Squamous Epithelial PRESENT (*)    Bacteria, UA FEW (*)    All other components within normal limits  SARS CORONAVIRUS 2 BY RT PCR  URINE CULTURE    EKG   Radiology No results found.  Procedures Procedures (including critical care  time)  Medications Ordered in UC Medications  sulfamethoxazole-trimethoprim (BACTRIM DS) 800-160 MG per tablet 1 tablet (has no administration in time range)    Initial Impression / Assessment and Plan / UC Course  I have reviewed the triage vital signs and the nursing notes.  Pertinent labs & imaging results that were available during my care of the patient were reviewed by me and considered in my medical decision making (see chart for details).   72 year old female presents for multiple complaints including fatigue, congestion, diarrhea and burning sensation in abdomen as well as urinary frequency/urgency and episodes of incontinence for the past few days.  Reports exposure to COVID.  Also reports concern for UTI.  Vitals are normal and stable.  She is overall well-appearing.  No acute distress.  On exam she has normal HEENT exam.  Chest clear to auscultation heart regular rate and rhythm.  Abdomen with mild left lower quadrant tenderness to palpation without guarding or rebound.  No CVA tenderness.  Urinalysis obtained today shows hazy appearance of urine with moderate leukocytes, clumps of white blood cells, bacteria.  Urine to be sent for culture.  Possible UTI.  Will treat with Bactrim DS.  She is unable to pick up the prescription at the pharmacy tonight since the pharmacy will close before she can pick it up.  Patient given 1 dose of Bactrim DS before leaving clinic tonight.  COVID testing negative today.  Advised plenty of rest and fluids and supportive care.  Use of OTC meds as needed for symptom control.  Advised that she should be feeling better in couple days when she starts antibiotics.  Reviewed return and ER precautions.   Final Clinical Impressions(s) / UC Diagnoses   Final diagnoses:  Acute cystitis without hematuria  Urinary frequency  Urinary incontinence, unspecified type  Other fatigue  Exposure to COVID-19 virus  Discharge Instructions      -Negative  COVID test.  -Urine obtained today appears to be consistent with UTI.  I have sent the urine for culture.  I sent antibiotics to pharmacy.  Will call if need to change antibiotic. - Increase your rest and fluids and be careful position changes especially if you feel dizzy. - If you begin to have fevers, increased or worsening fatigue or weakness, return for reevaluation or go to emergency department.  UTI: Based on either symptoms or urinalysis, you may have a urinary tract infection. We will send the urine for culture and call with results in a few days. Begin antibiotics at this time. Your symptoms should be much improved over the next 2-3 days. Increase rest and fluid intake. If for some reason symptoms are worsening or not improving after a couple of days or the urine culture determines the antibiotics you are taking will not treat the infection, the antibiotics may be changed. Return or go to ER for fever, back pain, worsening urinary pain, discharge, increased blood in urine. May take Tylenol or Motrin OTC for pain relief or consider AZO if no contraindications      ED Prescriptions     Medication Sig Dispense Auth. Provider   nitrofurantoin, macrocrystal-monohydrate, (MACROBID) 100 MG capsule  (Status: Discontinued) Take 1 capsule (100 mg total) by mouth 2 (two) times daily. 10 capsule Laurene Footman B, PA-C   sulfamethoxazole-trimethoprim (BACTRIM DS) 800-160 MG tablet Take 1 tablet by mouth 2 (two) times daily for 7 days. 14 tablet Gretta Cool      PDMP not reviewed this encounter.   Danton Clap, PA-C 02/18/23 [phone]

## 2023-02-19 LAB — URINE CULTURE: Culture: <10000 — AB

## 2023-02-21 ENCOUNTER — Ambulatory Visit
Admission: RE | Admit: 2023-02-21 | Discharge: 2023-02-21 | Disposition: A | Discharge status: Home / Self Care | Payer: Medicare Other | Source: Ambulatory Visit | Attending: Emergency Medicine | Admitting: Emergency Medicine

## 2023-02-21 VITALS — BP 117/75 | HR 66 | Temp 98.2°F | Resp 14 | Ht 64.0 in | Wt 167.0 lb

## 2023-02-21 DIAGNOSIS — J029 Acute pharyngitis, unspecified: Secondary | ICD-10-CM | POA: Diagnosis not present

## 2023-02-21 LAB — GROUP A STREP BY PCR: Group A Strep by PCR: NOT DETECTED

## 2023-02-21 NOTE — ED Triage Notes (Signed)
Patient c/o sore throat that started on Friday.  Patient was seen here on 02/18/23 and treated for UTI.  Patient denies fevers.

## 2023-02-21 NOTE — Discharge Instructions (Addendum)
Your strep test today was negative.  I believe you have a viral sore throat.  As long as you do not have a fever you are not infectious though I would not share food or drink with other people.  Gargle with warm salt water 2-3 times a day to soothe your throat, aid in pain relief, and aid in healing.  Take over-the-counter ibuprofen according to the package instructions as needed for pain.  You can also use Chloraseptic or Sucrets lozenges, 1 lozenge every 2 hours as needed for throat pain.  If you develop any new or worsening symptoms return for reevaluation.

## 2023-02-21 NOTE — ED Provider Notes (Signed)
[facility]    CSN: [id number] Arrival date & time: 02/21/23  1359      History   Chief Complaint Chief Complaint  Patient presents with   Sore Throat    Entered by patient    HPI Carly Lopez is a 72 y.o. female.   HPI  72 year old female with a past medical history significant for lung cancer, GERD, and depression presents for evaluation of sore throat that started 2 days ago.  Patient denies any fever.  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    Patient Active Problem List   Diagnosis Date Noted   Spinal meningioma ([place]) 10/06/2022   Single subsegmental pulmonary embolism without acute cor pulmonale ([place]) 03/07/2022   DOE (dyspnea on exertion) 02/27/2022   Fatigue 02/27/2022   S/P lobectomy of lung 01/21/2022   Generalized anxiety disorder 12/24/2021   Preoperative evaluation of a medical condition to rule out surgical contraindications (TAR required) 12/08/2021   Former tobacco use 06/09/2021   Stage 3a chronic kidney disease ([place]) 04/18/2021   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   Primary insomnia 01/05/2021   Aftercare following joint replacement 06/23/2020   Presence of right artificial knee joint 06/20/2020   Essential hypertension 12/01/2019   Major depressive disorder with single episode, 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   Migraine with vertigo 09/25/2019   History of TIA (transient ischemic attack) 04/04/2019   Smoking 01/04/2019   Transient ischemic attack 12/29/2018   Bochdalek hernia 03/24/2018   OA (osteoarthritis) of hip 07/13/2014   Hypercholesterolemia 10/09/2011   Irritable bowel syndrome with both constipation and diarrhea 10/09/2011   Gastroesophageal reflux disease without esophagitis 06/25/2011   History of diverticulitis of colon 06/25/2011   Osteopenia  06/25/2011    Past Surgical History:  Procedure Laterality Date   ABDOMINAL HYSTERECTOMY     CHOLECYSTECTOMY     TONSILLECTOMY      OB History   No obstetric history on file.      Home Medications    Prior to Admission medications   Medication Sig Start Date End Date Taking? Authorizing Provider  aspirin 81 MG EC tablet aspirin 81 mg tablet  Take by oral route.    [provider]  atorvastatin (LIPITOR) 20 MG tablet 20 mg DAILY (route: oral) 06/23/20   [provider]  buPROPion (WELLBUTRIN SR) 150 MG 12 hr tablet 1 tablet DAILY (route: oral) 05/30/20   [provider]  Cholecalciferol 50 MCG (2000 UT) CAPS Take by mouth.    [provider]  famotidine (PEPCID) 40 MG tablet Take 1 tablet every day by oral route. 10/03/21   [provider]  fluconazole (DIFLUCAN) 150 MG tablet Take 1 tablet (150 mg total) by mouth daily. 10/12/22   Rodriguez-Southworth, Sunday Spillers, PA-C  hydrochlorothiazide (HYDRODIURIL) 12.5 MG tablet Take 12.5 mg by mouth daily. 04/15/21   [provider]  lisinopril (ZESTRIL) 20 MG tablet 1 tablet DAILY (route: oral) 06/23/20   [provider]  pantoprazole (PROTONIX) 40 MG tablet 40 mg DAILY (route: oral) 06/23/20   [provider]  Rivaroxaban (XARELTO) 15 MG TABS tablet TAKE 1 TABLET BY MOUTH TWICE DAILY FOR 21 DAYS    [provider]  sertraline (ZOLOFT) 25 MG tablet Take 25 mg by mouth daily. 04/15/21   [provider]  sucralfate ([place])  1 G tablet Take 1 tablet (1 g total) by mouth 4 (four) times daily. 11/12/15   Nance Pear, MD  sulfamethoxazole-trimethoprim (BACTRIM DS) 800-160 MG tablet Take 1 tablet by mouth 2 (two) times daily for 7 days. 02/18/23 02/25/23  Danton Clap, PA-C    Family History Family History  Problem Relation Age of Onset   Breast cancer Maternal Aunt     Social History Social History   Tobacco Use   Smoking status: Every Day  Vaping  Use   Vaping Use: Never used  Substance Use Topics   Alcohol use: No   Drug use: Never     Allergies   Aleve [naproxen], Atorvastatin, Naproxen sodium, Levaquin [levofloxacin in d5w], Rosuvastatin, and Lovastatin   Review of Systems Review of Systems  Constitutional:  Negative for fever.  HENT:  Positive for rhinorrhea and sore throat. Negative for congestion.   Respiratory:  Negative for cough.      Physical Exam Triage Vital Signs ED Triage Vitals  Enc Vitals Group     BP      Pulse      Resp      Temp      Temp src      SpO2      Weight      Height      Head Circumference      Peak Flow      Pain Score      Pain Loc      Pain Edu?      Excl. in [place]?    No data found.  Updated Vital Signs BP 117/75 (BP Location: Right Arm)   Pulse 66   Temp 98.2 F (36.8 C) (Oral)   Resp 14   Ht 5\' 4"  (1.626 m)   Wt 167 lb (75.8 kg)   SpO2 96%   BMI 28.67 kg/m   Visual Acuity Right Eye Distance:   Left Eye Distance:   Bilateral Distance:    Right Eye Near:   Left Eye Near:    Bilateral Near:     Physical Exam Vitals and nursing note reviewed.  Constitutional:      Appearance: Normal appearance. She is not ill-appearing.  HENT:     Head: Normocephalic and atraumatic.     Mouth/Throat:     Mouth: Mucous membranes are moist.     Pharynx: Oropharynx is clear. Posterior oropharyngeal erythema present. No oropharyngeal exudate.     Comments: Tonsillar pillars are erythematous as well as the soft palate.  No exudate.  Posterior oropharynx has mild erythema with clear postnasal drip. Cardiovascular:     Rate and Rhythm: Normal rate and regular rhythm.     Pulses: Normal pulses.     Heart sounds: Normal heart sounds. No murmur heard.    No friction rub. No gallop.  Pulmonary:     Effort: Pulmonary effort is normal.     Breath sounds: Normal breath sounds. No wheezing, rhonchi or rales.  Musculoskeletal:     Cervical back: Normal range of motion and neck supple.   Lymphadenopathy:     Cervical: No cervical adenopathy.  Skin:    General: Skin is warm and dry.     Capillary Refill: Capillary refill takes less than 2 seconds.  Neurological:     Mental Status: She is alert.      UC Treatments / Results  Labs (all labs ordered are listed, but only abnormal results are displayed) Labs Reviewed  GROUP A  STREP BY PCR    EKG   Radiology No results found.  Procedures Procedures (including critical care time)  Medications Ordered in UC Medications - No data to display  Initial Impression / Assessment and Plan / UC Course  I have reviewed the triage vital signs and the nursing notes.  Pertinent labs & imaging results that were available during my care of the patient were reviewed by me and considered in my medical decision making (see chart for details).   Patient is a pleasant, nontoxic-appearing 72 year old female presenting for evaluation of sore throat that started 2 days ago.  She also endorses mild runny nose but denies any congestion, fever, or cough.  She says it hurts when she swallows.  She has not been around anyone with similar symptoms.  She was recently over visiting her mother and brother who are both recovering from [place].  She was seen in this urgent care 3 days ago and had a negative PCR for COVID at that time.  She does have erythema and mild tonsillar edema in the posterior oropharynx but no exudate.  No cervical of adenopathy present.  I will order a strep PCR.  Strep PCR is negative.  I will discharge patient on the diagnosis of viral pharyngitis.  She can use over-the-counter Tylenol and or ibuprofen as needed for pain.  Also salt water gargles to help soothe your tissues.  Any new or worsening symptoms she can return for reevaluation or see her PCP.   Final Clinical Impressions(s) / UC Diagnoses   Final diagnoses:  Viral pharyngitis     Discharge Instructions      Your strep test today was negative.  I believe  you have a viral sore throat.  As long as you do not have a fever you are not infectious though I would not share food or drink with other people.  Gargle with warm salt water 2-3 times a day to soothe your throat, aid in pain relief, and aid in healing.  Take over-the-counter ibuprofen according to the package instructions as needed for pain.  You can also use Chloraseptic or Sucrets lozenges, 1 lozenge every 2 hours as needed for throat pain.  If you develop any new or worsening symptoms return for reevaluation.      ED Prescriptions   None    PDMP not reviewed this encounter.   Margarette Canada, NP 02/21/23 1454

## 2023-02-24 ENCOUNTER — Ambulatory Visit
Admission: RE | Admit: 2023-02-24 | Discharge: 2023-02-24 | Disposition: A | Discharge status: Home / Self Care | Payer: Medicare Other | Source: Ambulatory Visit | Attending: Family Medicine | Admitting: Family Medicine

## 2023-02-24 ENCOUNTER — Inpatient Hospital Stay: Admission: RE | Admit: 2023-02-24 | Payer: Medicare Other | Source: Ambulatory Visit

## 2023-02-24 ENCOUNTER — Ambulatory Visit (INDEPENDENT_AMBULATORY_CARE_PROVIDER_SITE_OTHER): Payer: Medicare Other

## 2023-02-24 VITALS — BP 105/63 | HR 77 | Temp 98.5°F | Resp 16

## 2023-02-24 DIAGNOSIS — J069 Acute upper respiratory infection, unspecified: Secondary | ICD-10-CM

## 2023-02-24 DIAGNOSIS — I10 Essential (primary) hypertension: Secondary | ICD-10-CM

## 2023-02-24 DIAGNOSIS — Z20822 Contact with and (suspected) exposure to covid-19: Secondary | ICD-10-CM

## 2023-02-24 DIAGNOSIS — Z87891 Personal history of nicotine dependence: Secondary | ICD-10-CM

## 2023-02-24 LAB — SARS CORONAVIRUS 2 BY RT PCR: SARS Coronavirus 2 by RT PCR: NEGATIVE

## 2023-02-24 MED ORDER — BENZONATATE 100 MG PO CAPS: 100.0000 mg | ORAL_CAPSULE | Freq: Three times a day (TID) | ORAL | 0 refills | Status: DC

## 2023-02-24 MED ORDER — FLUCONAZOLE 150 MG PO TABS: 150.0000 mg | ORAL_TABLET | ORAL | 0 refills | Status: AC

## 2023-02-24 MED ORDER — IPRATROPIUM BROMIDE 0.06 % NA SOLN: 2.0000 | Freq: Four times a day (QID) | NASAL | 12 refills | Status: DC

## 2023-02-24 MED ORDER — PROMETHAZINE-DM 6.25-15 MG/5ML PO SYRP: 5.0000 mL | ORAL_SOLUTION | Freq: Four times a day (QID) | ORAL | 0 refills | Status: DC | PRN

## 2023-02-24 NOTE — ED Provider Notes (Signed)
[facility]    CSN: [id number] Arrival date & time: 02/24/23  1247      History   Chief Complaint Chief Complaint  Patient presents with   Cough    Entered by patient    HPI Kailynne Hogwood is a 72 y.o. female.   HPI   Janani presents for productive cough for the past 3 days. Took some Coricidin yesterday.  She started having a sore throat a few days ago.  Notes that her brother and her mom both have COVID.  Reports that they have been around her but seemed well.  Endorses nasal congestion, headache and sinus pressure.  There is been no fever, vomiting or diarrhea.  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 Follows with pulmonology.    Past Medical History:  Diagnosis Date   Depression    GERD (gastroesophageal reflux disease)    Malignant neoplasm of right upper lobe of lung ([place]) 01/05/2021    Patient Active Problem List   Diagnosis Date Noted   Spinal meningioma ([place]) 10/06/2022   Single subsegmental pulmonary embolism without acute cor pulmonale ([place]) 03/07/2022   DOE (dyspnea on exertion) 02/27/2022   Fatigue 02/27/2022   S/P lobectomy of lung 01/21/2022   Generalized anxiety disorder 12/24/2021   Preoperative evaluation of a medical condition to rule out surgical contraindications (TAR required) 12/08/2021   Former tobacco use 06/09/2021   Stage 3a chronic kidney disease ([place]) 04/18/2021   Malignant neoplasm of right upper lobe of lung ([place]) 01/05/2021   Primary insomnia 01/05/2021   Aftercare following joint replacement 06/23/2020   Presence of right artificial knee joint 06/20/2020   Essential hypertension 12/01/2019   Major depressive disorder with single episode, 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/2019   Migraine with vertigo 09/25/2019   History of TIA (transient ischemic attack) 04/04/2019   Smoking 01/04/2019   Transient ischemic attack 12/29/2018   Bochdalek hernia 03/24/2018   OA  (osteoarthritis) of hip 07/13/2014   Hypercholesterolemia 10/09/2011   Irritable bowel syndrome with both constipation and diarrhea 10/09/2011   Gastroesophageal reflux disease without esophagitis 06/25/2011   History of diverticulitis of colon 06/25/2011   Osteopenia 06/25/2011    Past Surgical History:  Procedure Laterality Date   ABDOMINAL HYSTERECTOMY     CHOLECYSTECTOMY     TONSILLECTOMY      OB History   No obstetric history on file.      Home Medications    Prior to Admission medications   Medication Sig Start Date End Date Taking? Authorizing Provider  benzonatate (TESSALON) 100 MG capsule Take 1 capsule (100 mg total) by mouth every 8 (eight) hours. 02/24/23  Yes Makiah Clauson, DO  fluconazole (DIFLUCAN) 150 MG tablet Take 1 tablet (150 mg total) by mouth every 3 (three) days for 2 doses. 02/24/23 02/28/23 Yes Tahni Porchia, DO  ipratropium (ATROVENT) 0.06 % nasal spray Place 2 sprays into both nostrils 4 (four) times daily. 02/24/23  Yes Mirinda Monte, DO  promethazine-dextromethorphan (PROMETHAZINE-DM) 6.25-15 MG/5ML syrup Take 5 mLs by mouth 4 (four) times daily as needed. 02/24/23  Yes Punam Broussard, DO  aspirin 81 MG EC tablet aspirin 81 mg tablet  Take by oral route.    [provider]  atorvastatin (LIPITOR) 20 MG tablet 20 mg DAILY (route: oral) 06/23/20   [provider]  buPROPion (WELLBUTRIN SR) 150 MG 12 hr tablet 1 tablet DAILY (route: oral) 05/30/20   [provider]  Cholecalciferol 50 MCG (2000 UT) CAPS Take by mouth.    [provider]  famotidine (PEPCID) 40 MG tablet Take 1 tablet every day by oral route. 10/03/21   [provider]  hydrochlorothiazide (HYDRODIURIL) 12.5 MG tablet Take 12.5 mg by mouth daily. 04/15/21   [provider]  lisinopril (ZESTRIL) 20 MG tablet 1 tablet DAILY (route: oral) 06/23/20   [provider]  pantoprazole (PROTONIX) 40 MG tablet 40 mg DAILY (route: oral)  06/23/20   [provider]  Rivaroxaban (XARELTO) 15 MG TABS tablet TAKE 1 TABLET BY MOUTH TWICE DAILY FOR 21 DAYS    [provider]  sertraline (ZOLOFT) 25 MG tablet Take 25 mg by mouth daily. 04/15/21   [provider]  sucralfate (CARAFATE) 1 G tablet Take 1 tablet (1 g total) by mouth 4 (four) times daily. 11/12/15   Nance Pear, MD  sulfamethoxazole-trimethoprim (BACTRIM DS) 800-160 MG tablet Take 1 tablet by mouth 2 (two) times daily for 7 days. 02/18/23 02/25/23  Danton Clap, PA-C    Family History Family History  Problem Relation Age of Onset   Breast cancer Maternal Aunt     Social History Social History   Tobacco Use   Smoking status: Every Day  Vaping Use   Vaping Use: Never used  Substance Use Topics   Alcohol use: No   Drug use: Never     Allergies   Aleve [naproxen], Atorvastatin, Naproxen sodium, Levaquin [levofloxacin in d5w], Rosuvastatin, and Lovastatin   Review of Systems Review of Systems: negative unless otherwise stated in HPI.      Physical Exam Triage Vital Signs ED Triage Vitals [02/24/23 1305]  Enc Vitals Group     BP (!) 166/64     Pulse Rate 80     Resp 16     Temp      Temp Source Oral     SpO2 96 %     Weight      Height      Head Circumference      Peak Flow      Pain Score      Pain Loc      Pain Edu?      Excl. in [place]?    No data found.  Updated Vital Signs BP 105/63   Pulse 77   Temp 98.5 F (36.9 C) (Oral)   Resp 16   SpO2 96%   Visual Acuity Right Eye Distance:   Left Eye Distance:   Bilateral Distance:    Right Eye Near:   Left Eye Near:    Bilateral Near:     Physical Exam GEN:     alert, non-toxic appearing female in no distress    HENT:  mucus membranes moist, oropharyngeal without lesions or exudate, no tonsillar hypertrophy, mild oropharyngeal erythema, moderate erythematous edematous turbinates, clear nasal discharge, left TM effusion, no erythema or bulging, right TM  normal EYES:   pupils equal and reactive, no scleral injection or discharge NECK:  normal ROM, no meningismus   RESP:  no increased work of breathing, clear to auscultation bilaterally CVS:   regular rate and rhythm Skin:   warm and dry, no rash on visible skin    UC Treatments / Results  Labs (all labs ordered are listed, but only abnormal results are displayed) Labs Reviewed  SARS CORONAVIRUS 2 BY RT PCR    EKG   Radiology DG Chest 2 View  Result Date: 02/24/2023 CLINICAL DATA:  Cough for 3 days, history of lung cancer EXAM: CHEST - 2 VIEW COMPARISON:  11/12/2015 FINDINGS: Frontal and lateral views of the chest demonstrate a stable cardiac silhouette. Postsurgical changes are seen from right upper lobectomy. No acute airspace disease, effusion, or pneumothorax. No acute bony abnormality. IMPRESSION: 1. No acute intrathoracic process. Electronically Signed   By: Randa Ngo M.D.   On: 02/24/2023 14:09    Procedures Procedures (including critical care time)  Medications Ordered in UC Medications - No data to display  Initial Impression / Assessment and Plan / UC Course  I have reviewed the triage vital signs and the nursing notes.  Pertinent labs & imaging results that were available during my care of the patient were reviewed by me and considered in my medical decision making (see chart for details).       Pt is a 72 y.o. female with history of smoking and lung cancer who presents for respiratory symptoms. Danuta is afebrile here. Satting well on room air. Overall pt is non-toxic appearing, well hydrated, without respiratory distress. Pulmonary exam is unremarkable.  COVID  testing obtained and was negative.  Patient worried that she has pneumonia.  Chest x-ray ordered. Chest xray personally reviewed by me without focal pneumonia, pleural effusion, cardiomegaly or pneumothorax. History consistent with viral respiratory illness. Discussed symptomatic treatment.  Explained  lack of efficacy of antibiotics in viral disease.  Typical duration of symptoms discussed.  Prescribed Promethazine DM for cough, Atrovent nasal spray for nasal congestion and Tessalon Perles.   Zamya is hypertensive. Takes Lisinopril 20 mg daily. Says she no longer takes HCTZ 12.5 mg. Per chart review, has had lower BP 70/50s in the past. Repeat BP here 105/63.  No changes made. Pt to follow up with PCP regarding BP if intermittently fluctuates.    She recently took antibiotics for a UTI.  Reports vaginal discomfort.  Suspect yeast infection related to recent antibiotic use.  Prescribed Diflucan.  Return and ED precautions given and voiced understanding. Discussed MDM, treatment plan and plan for follow-up with patient who agrees with plan.     Final Clinical Impressions(s) / UC Diagnoses   Final diagnoses:  Close exposure to COVID-19 virus  Viral URI with cough     Discharge Instructions      Your COVID test is negative.  Your chest x-ray did not show a pneumonia.  I do believe you have a upper respiratory infection that is caused by a virus.  No antibiotics are needed at this time. Stop by the pharmacy to pick up your prescriptions.  Follow up with your primary care provider as needed.      ED Prescriptions     Medication Sig Dispense Auth. Provider   promethazine-dextromethorphan (PROMETHAZINE-DM) 6.25-15 MG/5ML syrup Take 5 mLs by mouth 4 (four) times daily as needed. 118 mL Deiondra Denley, DO   ipratropium (ATROVENT) 0.06 % nasal spray Place 2 sprays into both nostrils 4 (four) times daily. 15 mL Azlan Hanway, DO   benzonatate (TESSALON) 100 MG capsule Take 1 capsule (100 mg total) by mouth every 8 (eight) hours. 21 capsule Yariela Tison, DO   fluconazole (DIFLUCAN) 150 MG tablet Take 1 tablet (150 mg total) by mouth every 3 (three) days for 2 doses. 2 tablet Lyndee Hensen, DO      PDMP not reviewed this encounter.   Lyndee Hensen, DO 02/24/23 1426

## 2023-02-24 NOTE — Discharge Instructions (Addendum)
Your COVID test is negative.  Your chest x-ray did not show a pneumonia.  I do believe you have a upper respiratory infection that is caused by a virus.  No antibiotics are needed at this time. Stop by the pharmacy to pick up your prescriptions.  Follow up with your primary care provider as needed.

## 2023-02-24 NOTE — ED Triage Notes (Signed)
Patient presents to UC for cough x 3 days. Treating cough with coricidin once yesterday.   Denies fever.

## 2023-03-10 ENCOUNTER — Other Ambulatory Visit: Payer: Medicare Other

## 2023-06-06 ENCOUNTER — Ambulatory Visit: Payer: Medicare Other

## 2023-06-10 ENCOUNTER — Other Ambulatory Visit: Payer: Self-pay | Admitting: Family Medicine

## 2023-06-10 DIAGNOSIS — Z1231 Encounter for screening mammogram for malignant neoplasm of breast: Secondary | ICD-10-CM

## 2023-08-05 ENCOUNTER — Ambulatory Visit
Admission: RE | Admit: 2023-08-05 | Discharge: 2023-08-05 | Disposition: A | Discharge status: Home / Self Care | Payer: Medicare Other | Source: Ambulatory Visit | Attending: Emergency Medicine | Admitting: Emergency Medicine

## 2023-08-05 VITALS — BP 97/61 | HR 73 | Temp 98.0°F | Ht 64.0 in | Wt 174.0 lb

## 2023-08-05 DIAGNOSIS — R5381 Other malaise: Secondary | ICD-10-CM | POA: Diagnosis not present

## 2023-08-05 DIAGNOSIS — N390 Urinary tract infection, site not specified: Secondary | ICD-10-CM | POA: Diagnosis not present

## 2023-08-05 DIAGNOSIS — H9201 Otalgia, right ear: Secondary | ICD-10-CM | POA: Diagnosis not present

## 2023-08-05 LAB — URINALYSIS, W/ REFLEX TO CULTURE (INFECTION SUSPECTED)
Glucose, UA: NEGATIVE mg/dL
Hgb urine dipstick: NEGATIVE
Ketones, ur: 15 mg/dL — AB
Nitrite: NEGATIVE
Specific Gravity, Urine: 1.025 (ref 1.005–1.030)
pH: 5.5 (ref 5.0–8.0)

## 2023-08-05 MED ORDER — FLUTICASONE PROPIONATE 50 MCG/ACT NA SUSP: 2.0000 | Freq: Every day | NASAL | 0 refills | Status: AC

## 2023-08-05 MED ORDER — CEFDINIR 300 MG PO CAPS: 300.0000 mg | ORAL_CAPSULE | Freq: Two times a day (BID) | ORAL | 0 refills | Status: AC

## 2023-08-05 NOTE — Discharge Instructions (Addendum)
Your EKG did not show any concerning changes.  Your orthostatics were normal except your heart rate was a little slow.  This could be causing your symptoms.  I would check in with your primary care provider and/your cardiologist if they persist.  I also suspect that you are somewhat dehydrated, which caused your symptoms today.  Push electrolyte containing fluids such as Pedialyte, Gatorade or liquid IV until your urine is clear.  The Omnicef will treat a sinus infection in addition to a urinary tract infection.  I have sent your urine off for culture to make sure that we have you on the correct antibiotic. Flonase, saline nasal irrigation with a NeilMed sinus rinse and distilled water as often as you want.  You can also try some Mucinex.

## 2023-08-05 NOTE — ED Triage Notes (Signed)
Patient states overall doesn't feel well for several days, 7 of 10 right ear/neck pain.  Felt lightheaded on waking this am and BP low 98/77 at home.  Feels burning with urination x a few days as well

## 2023-08-05 NOTE — ED Provider Notes (Signed)
HPI  SUBJECTIVE:  Carly Lopez is a 72 y.o. female who presents with "not feeling well" for the past 4 days.  She reports dull, constant right ear pain that radiates into her neck, frontal/sinus headache, nasal congestion, sinus pain and pressure during this time.  No fevers, change in her baseline rhinorrhea, change in hearing, otorrhea, chest pain, change in her baseline shortness of breath, vertigo, tinnitus.  Denies foreign body insertion to the ear, recent swimming, dental pain.  No jaw popping.  She tried Tylenol without improvement in her symptoms.  No aggravating factors.  No antipyretic in the past 6 hours.  She also reports dysuria, burning pain with urination, urinary urgency, frequency, cloudy urine starting earlier this morning.  No odorous urine, hematuria, abdominal, pelvic pain.  She reports bilateral lower back pain.  No recent antibiotics.  She states that she was going down some stairs today, and states that she felt nauseous, lightheaded and as if she was about to "pass out".  This lasted about 5 minutes.  She measured her blood pressure at 98/77.  No chest pain, palpitations, syncope.  No recent change in her medications.  She has had nausea and lightheadedness like this before, which was found to be due to dehydration.  She states that she did not eat or drink much yesterday due to feeling unwell.  Patient has a past medical history of TIA, stage III chronic kidney disease, lung cancer, GERD, hypertension, states that her baseline blood pressure is 110's-130s/70- 90s.  She has a history of coronary artery disease, shortness of breath, and had a cardiac catheterization 1 month ago due to an shortness of breath/abnormal stress test and states that the catheterization was normal.  No history of MI.  She also has a history of frequent UTIs.  PCP: [facility].  Past Medical History:  Diagnosis Date   Depression    GERD (gastroesophageal reflux disease)     Malignant neoplasm of right upper lobe of lung ([facility]) 01/05/2021    Past Surgical History:  Procedure Laterality Date   ABDOMINAL HYSTERECTOMY     CHOLECYSTECTOMY     TONSILLECTOMY      Family History  Problem Relation Age of Onset   Breast cancer Maternal Aunt     Social History   Tobacco Use   Smoking status: Former    Current packs/day: 0.00    Types: Cigarettes    Quit date: 2022    Years since quitting: 2.6   Smokeless tobacco: Never  Vaping Use   Vaping status: Never Used  Substance Use Topics   Alcohol use: No   Drug use: Never    No current facility-administered medications for this encounter.  Current Outpatient Medications:    cefdinir (OMNICEF) 300 MG capsule, Take 1 capsule (300 mg total) by mouth 2 (two) times daily for 7 days., Disp: 14 capsule, Rfl: 0   fluticasone (FLONASE) 50 MCG/ACT nasal spray, Place 2 sprays into both nostrils daily., Disp: 18 mL, Rfl: 0   aspirin 81 MG EC tablet, aspirin 81 mg tablet  Take by oral route., Disp: , Rfl:    atorvastatin (LIPITOR) 20 MG tablet, 20 mg DAILY (route: oral), Disp: , Rfl:    buPROPion (WELLBUTRIN SR) 150 MG 12 hr tablet, 1 tablet DAILY (route: oral), Disp: , Rfl:    Cholecalciferol 50 MCG (2000 UT) CAPS, Take by mouth., Disp: , Rfl:    famotidine (PEPCID) 40 MG tablet, Take 1 tablet every day by oral  route., Disp: , Rfl:    hydrochlorothiazide (HYDRODIURIL) 12.5 MG tablet, Take 12.5 mg by mouth daily., Disp: , Rfl:    lisinopril (ZESTRIL) 20 MG tablet, 1 tablet DAILY (route: oral), Disp: , Rfl:    pantoprazole (PROTONIX) 40 MG tablet, 40 mg DAILY (route: oral), Disp: , Rfl:    Rivaroxaban (XARELTO) 15 MG TABS tablet, TAKE 1 TABLET BY MOUTH TWICE DAILY FOR 21 DAYS, Disp: , Rfl:    sertraline (ZOLOFT) 25 MG tablet, Take 25 mg by mouth daily., Disp: , Rfl:   Allergies  Allergen Reactions   Aleve [Naproxen] Anaphylaxis   Atorvastatin Shortness Of Breath   Naproxen Sodium Hives, Itching, Other (See Comments),  Rash and Swelling   Levaquin [Levofloxacin In D5w]    Rosuvastatin Other (See Comments)    Memory loss   Lovastatin Palpitations    Joint pain, irritability     ROS  As noted in HPI.   Physical Exam  BP 97/61 (BP Location: Left Arm)   Pulse 73   Temp 98 F (36.7 C) (Oral)   Ht 5\' 4"  (1.626 m)   Wt 78.9 kg   SpO2 97%   BMI 29.87 kg/m   BP Readings from Last 3 Encounters:  08/05/23 97/61  02/24/23 105/63  02/21/23 117/75   Orthostatic VS for the past 24 hrs:  BP- Lying Pulse- Lying BP- Sitting Pulse- Sitting  08/05/23 1411 129/74 51 133/79 53  Standing 145/81 HR 58  Heart rate has been in the mid 60s to 77 over the last 3 visits   Constitutional: Well developed, well nourished, no acute distress Eyes:  EOMI, conjunctiva normal bilaterally HENT: Normocephalic, atraumatic,mucus membranes moist.  Positive nasal congestion.  Positive maxillary sinus tenderness.  No frontal sinus tenderness. Bilateral ears: External ear, EAC, TM normal.  TMJ nontender.  No tenderness over the tragus, mastoid, pain with traction on pinna.  Hearing equally intact bilaterally. Neck: No appreciable cervical lymphadenopathy.  Mild tenderness over the sternocleidomastoid muscle.  No carotid bruit. Respiratory: Normal inspiratory effort Cardiovascular: Normal rate, regular rhythm, no murmurs rubs or gallops GI: nondistended soft, nontender, no rebound, guarding.  Active bowel sounds. Back: No CVAT.  Bilateral paralumbar tenderness. skin: No rash, skin intact Musculoskeletal: no deformities Neurologic: Alert & oriented x 3, no focal neuro deficits Psychiatric: Speech and behavior appropriate   ED Course   Medications - No data to display  Orders Placed This Encounter  Procedures   Urinalysis, w/ Reflex to Culture (Infection Suspected) -Urine, Clean Catch    Standing Status:   Standing    Number of Occurrences:   1    Order Specific Question:   Specimen Source    Answer:   Urine, Clean  Catch [[id number]]   Orthostatic vital signs    Standing Status:   Standing    Number of Occurrences:   1   ED EKG    Lightheadedness    Standing Status:   Standing    Number of Occurrences:   1    Order Specific Question:   Reason for Exam    Answer:   Other (See Comments)   EKG 12-Lead    Standing Status:   Standing    Number of Occurrences:   1   EKG 12-Lead    Standing Status:   Standing    Number of Occurrences:   1    Results for orders placed or performed during the hospital encounter of 08/05/23 (from the past 24 hour(s))  Urinalysis,  w/ Reflex to Culture (Infection Suspected) -Urine, Clean Catch     Status: Abnormal   Collection Time: 08/05/23 12:19 PM  Result Value Ref Range   Specimen Source URINE, CLEAN CATCH    Color, Urine YELLOW YELLOW   APPearance HAZY (A) CLEAR   Specific Gravity, Urine 1.025 1.005 - 1.030   pH 5.5 5.0 - 8.0   Glucose, UA NEGATIVE NEGATIVE mg/dL   Hgb urine dipstick NEGATIVE NEGATIVE   Bilirubin Urine SMALL (A) NEGATIVE   Ketones, ur 15 (A) NEGATIVE mg/dL   Protein, ur TRACE (A) NEGATIVE mg/dL   Nitrite NEGATIVE NEGATIVE   Leukocytes,Ua TRACE (A) NEGATIVE   Squamous Epithelial / HPF 11-20 0 - 5 /HPF   WBC, UA 11-20 0 - 5 WBC/hpf   RBC / HPF 0-5 0 - 5 RBC/hpf   Bacteria, UA FEW (A) NONE SEEN   Mucus PRESENT    Hyaline Casts, UA PRESENT    No results found.  ED Clinical Impression  1. Urinary tract infection without hematuria, site unspecified   2. Otalgia, right   3. Malaise      ED Assessment/Plan      1.  Right otalgia.  Does not seem to be an otitis media, TMJ dysfunction.  No evidence of shingles.  No evidence of mastoiditis, otitis externa.  Could be eustachian tube dysfunction from nasal congestion.  I suspect that she has a frontal sinus infection with a sinus tenderness, nasal congestion.  No strokelike symptoms.  Doubt acute carotid artery occlusion or dissection.  She has tenderness along her right neck, but it is reproducible  with palpation and turning her head.  2.  Malaise, nausea, lightheadedness lasting about 5 minutes this morning-checking orthostatics.  Patient states that she did not eat and drink much yesterday because she was feeling unwell.  Her blood pressure here is the same as it was at home.  I suspect that she is dehydrated as she has some ketones in her urine.  However, we will check an EKG.  EKG: Sinus bradycardia, rate 52.  Normal axis, normal intervals.  No hypertrophy.  No ST-T wave changes.  No changes from EKG November 2023  She is not orthostatic, but her heart rate is in the 50s.  She was slightly symptomatic when standing up.  I wonder if she is having symptomatic bradycardia.  She will need to follow-up with her primary care provider and/or cardiology.  No ischemic changes on EKG.  Her blood pressure is a little low compared to previous readings, where she was normotensive, but she is not hypotensive.  Could be from mild  3.  UTI.  Symptoms started this morning.  She has trace leukocytes, pyuria, and bacteria.  Send this off for culture to confirm diagnosis of UTI and antibiotic choice.  Urine cultures reviewed.  Last UTI was in November 2023 which was an E. coli UTI that was resistant to ampicillin and ciprofloxacin, but sensitive to cephalosporins.  Will send home with [facility] which should take care of a sinusitis in addition to a UTI.  Flonase, saline nasal irrigation, push electrolyte containing fluids.  Follow-up with PCP in 3 days.  Strict ER return precautions given.  Discussed labs,  MDM, treatment plan, and plan for follow-up with patient. Discussed sn/sx that should prompt return to the ED. patient agrees with plan.   Meds ordered this encounter  Medications   cefdinir (OMNICEF) 300 MG capsule    Sig: Take 1 capsule (300 mg total) by  mouth 2 (two) times daily for 7 days.    Dispense:  14 capsule    Refill:  0   fluticasone (FLONASE) 50 MCG/ACT nasal spray    Sig: Place 2 sprays  into both nostrils daily.    Dispense:  18 mL    Refill:  0      *This clinic note was created using Scientist, clinical (histocompatibility and immunogenetics). Therefore, there may be occasional mistakes despite careful proofreading.  ?    Domenick Gong, MD 08/06/23 [phone]

## 2023-08-16 ENCOUNTER — Ambulatory Visit: Payer: Medicare Other

## 2023-09-01 ENCOUNTER — Ambulatory Visit
Admission: RE | Admit: 2023-09-01 | Discharge: 2023-09-01 | Disposition: A | Discharge status: Home / Self Care | Payer: Medicare Other | Source: Ambulatory Visit | Attending: Family Medicine | Admitting: Family Medicine

## 2023-09-01 DIAGNOSIS — Z1231 Encounter for screening mammogram for malignant neoplasm of breast: Secondary | ICD-10-CM | POA: Diagnosis present

## 2023-12-07 ENCOUNTER — Other Ambulatory Visit: Payer: Self-pay | Admitting: Family Medicine

## 2023-12-07 DIAGNOSIS — E2839 Other primary ovarian failure: Secondary | ICD-10-CM

## 2024-01-18 ENCOUNTER — Other Ambulatory Visit: Payer: Self-pay | Admitting: Physical Medicine & Rehabilitation

## 2024-01-18 DIAGNOSIS — G8929 Other chronic pain: Secondary | ICD-10-CM

## 2024-01-28 ENCOUNTER — Other Ambulatory Visit: Payer: Medicare Other

## 2024-02-15 ENCOUNTER — Ambulatory Visit (INDEPENDENT_AMBULATORY_CARE_PROVIDER_SITE_OTHER)

## 2024-02-15 ENCOUNTER — Ambulatory Visit
Admission: RE | Admit: 2024-02-15 | Discharge: 2024-02-15 | Disposition: A | Discharge status: Home / Self Care | Source: Ambulatory Visit | Attending: Emergency Medicine | Admitting: Emergency Medicine

## 2024-02-15 ENCOUNTER — Ambulatory Visit

## 2024-02-15 VITALS — BP 100/58 | HR 65 | Temp 98.0°F | Resp 18

## 2024-02-15 DIAGNOSIS — R42 Dizziness and giddiness: Secondary | ICD-10-CM | POA: Insufficient documentation

## 2024-02-15 DIAGNOSIS — R5383 Other fatigue: Secondary | ICD-10-CM | POA: Insufficient documentation

## 2024-02-15 DIAGNOSIS — G43911 Migraine, unspecified, intractable, with status migrainosus: Secondary | ICD-10-CM | POA: Insufficient documentation

## 2024-02-15 DIAGNOSIS — G459 Transient cerebral ischemic attack, unspecified: Secondary | ICD-10-CM | POA: Diagnosis not present

## 2024-02-15 LAB — COMPREHENSIVE METABOLIC PANEL
ALT: 16 U/L (ref 0–44)
AST: 18 U/L (ref 15–41)
Albumin: 4.1 g/dL (ref 3.5–5.0)
Alkaline Phosphatase: 71 U/L (ref 38–126)
Anion gap: 8 (ref 5–15)
BUN: 19 mg/dL (ref 8–23)
CO2: 23 mmol/L (ref 22–32)
Calcium: 9.6 mg/dL (ref 8.9–10.3)
Chloride: 103 mmol/L (ref 98–111)
Creatinine, Ser: 1.02 mg/dL — ABNORMAL HIGH (ref 0.44–1.00)
GFR, Estimated: 59 mL/min — ABNORMAL LOW (ref >60)
Glucose, Bld: 106 mg/dL — ABNORMAL HIGH (ref 70–99)
Potassium: 4.1 mmol/L (ref 3.5–5.1)
Sodium: 134 mmol/L — ABNORMAL LOW (ref 135–145)
Total Bilirubin: 0.5 mg/dL (ref 0.0–1.2)
Total Protein: 7.5 g/dL (ref 6.5–8.1)

## 2024-02-15 LAB — CBC WITH DIFFERENTIAL/PLATELET
Abs Immature Granulocytes: 0.04 10*3/uL (ref 0.00–0.07)
Basophils Absolute: 0 10*3/uL (ref 0.0–0.1)
Basophils Relative: 1 %
Eosinophils Absolute: 0.1 10*3/uL (ref 0.0–0.5)
Eosinophils Relative: 2 %
HCT: 40.9 % (ref 36.0–46.0)
Hemoglobin: 13.4 g/dL (ref 12.0–15.0)
Immature Granulocytes: 1 %
Lymphocytes Relative: 15 %
Lymphs Abs: 1.2 10*3/uL (ref 0.7–4.0)
MCH: 26.5 pg (ref 26.0–34.0)
MCHC: 32.8 g/dL (ref 30.0–36.0)
MCV: 81 fL (ref 80.0–100.0)
Monocytes Absolute: 0.8 10*3/uL (ref 0.1–1.0)
Monocytes Relative: 10 %
Neutro Abs: 5.8 10*3/uL (ref 1.7–7.7)
Neutrophils Relative %: 71 %
Platelets: 285 10*3/uL (ref 150–400)
RBC: 5.05 MIL/uL (ref 3.87–5.11)
RDW: 14.2 % (ref 11.5–15.5)
WBC: 8 10*3/uL (ref 4.0–10.5)
nRBC: 0 % (ref 0.0–0.2)

## 2024-02-15 MED ORDER — MECLIZINE HCL 12.5 MG PO TABS: 12.5000 mg | ORAL_TABLET | Freq: Three times a day (TID) | ORAL | 0 refills | Status: AC | PRN

## 2024-02-15 MED ORDER — RIZATRIPTAN BENZOATE 10 MG PO TABS: 10.0000 mg | ORAL_TABLET | ORAL | 0 refills | Status: AC | PRN

## 2024-02-15 NOTE — Discharge Instructions (Addendum)
 Your head CT, blood work, EKG, chest x-ray were all very reassuring.  The exact cause of your headache and dizziness is unclear.  It may be a new presentation of your previous migraines and vertigo.  Use the meclizine 12.5 mg every 8 hours as needed for dizziness and vertigo-like symptoms.  Use the Maxalt as needed for your headache pain.  You may take 1 tablet at onset of headache and repeat in 2 hours.  No more than a maximum of 3 tablets in a 24-hour period however.  Contact your primary care provider and ask for a referral to neurology for further evaluation and management of your symptoms, especially if they do not improve.  If you have any sharp increase in your headache pain, develop changes in your vision, develop forceful nausea vomiting, develop numbness, tingling, weakness in any of your extremities, or develop any fainting you need to call 911 and go to the ER.

## 2024-02-15 NOTE — ED Provider Notes (Signed)
 [facility]    CSN: [id number] Arrival date & time: 02/15/24  1252      History   Chief Complaint Chief Complaint  Patient presents with   Dizziness    Headache. Chills - Entered by patient   Headache    HPI Carly Lopez is a 73 y.o. female.   HPI  74 year old female with past medical history significant for malignant neoplasm of right upper lobe of lung, GERD, depression, spinal meningioma, history of TIA, fatigue, GERD, stage III kidney disease, and migraine headache with vertigo presents for evaluation of frontal headache that extends into the temporal regions bilaterally that she describes as a constant pressure.  She rates it as a maximum as an 8/10 and is currently a 3/10.  The pain has been constant over the last 2 weeks.  She states this is different than her migraine presentation and she never had headache since location before.  She thought it might be sinus related but she has been taking over-the-counter antihistamines and sinus medication without any improvement of symptoms.  She states that she generally feels weak overall, has been experiencing chills in her legs, had a single episode of diarrhea last week, has had some nausea, and shortness of breath.  She describes her dizziness as more being a feeling of off balance.  She recently had a blood pressure cuff at home calibrated and reports that 1 time in the last week her blood pressure was 60/40.  She reports that she has had this in the past and is gone to the ER but did not go this time.  She also Dors is nausea and shortness of breath.  She denies any numbness, tingling, weakness in her extremities, changes in vision, vomiting, syncope, or chest pain.  Past Medical History:  Diagnosis Date   Depression    GERD (gastroesophageal reflux disease)    Malignant neoplasm of right upper lobe of lung (HCC) 01/05/2021    Patient Active Problem List   Diagnosis Date Noted   Spinal meningioma (HCC)  10/06/2022   Single subsegmental pulmonary embolism without acute cor pulmonale (HCC) 03/07/2022   DOE (dyspnea on exertion) 02/27/2022   Fatigue 02/27/2022   S/P lobectomy of lung 01/21/2022   Generalized anxiety disorder 12/24/2021   Preoperative evaluation of a medical condition to rule out surgical contraindications (TAR required) 12/08/2021   Former tobacco use 06/09/2021   Stage 3a chronic kidney disease (HCC) 04/18/2021   Malignant neoplasm of right upper lobe of lung (HCC) 01/05/2021   Primary insomnia 01/05/2021   Aftercare following joint replacement 06/23/2020   Presence of right artificial knee joint 06/20/2020   Essential hypertension 12/01/2019   Major depressive disorder with single episode, 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/01/2019   Migraine with vertigo 09/25/2019   History of TIA (transient ischemic attack) 04/04/2019   Smoking 01/04/2019   Transient ischemic attack 12/29/2018   Bochdalek hernia 03/24/2018   OA (osteoarthritis) of hip 07/13/2014   Hypercholesterolemia 10/09/2011   Irritable bowel syndrome with both constipation and diarrhea 10/09/2011   Gastroesophageal reflux disease without esophagitis 06/25/2011   History of diverticulitis of colon 06/25/2011   Osteopenia 06/25/2011    Past Surgical History:  Procedure Laterality Date   ABDOMINAL HYSTERECTOMY     CHOLECYSTECTOMY     TONSILLECTOMY      OB History   No obstetric history on file.      Home Medications    Prior to Admission medications   Medication Sig Start  Date End Date Taking? Authorizing Provider  meclizine (ANTIVERT) 12.5 MG tablet Take 1 tablet (12.5 mg total) by mouth 3 (three) times daily as needed for dizziness. 02/15/24  Yes Becky Augusta, NP  rizatriptan (MAXALT) 10 MG tablet Take 1 tablet (10 mg total) by mouth as needed for migraine. May repeat in 2 hours if needed 02/15/24  Yes Becky Augusta, NP  aspirin 81 MG EC tablet aspirin 81 mg tablet  Take by oral route.    [provider]  atorvastatin (LIPITOR) 20 MG tablet 20 mg DAILY (route: oral) 06/23/20   [provider]  buPROPion (WELLBUTRIN SR) 150 MG 12 hr tablet 1 tablet DAILY (route: oral) 05/30/20   [provider]  Cholecalciferol 50 MCG (2000 UT) CAPS Take by mouth.    [provider]  famotidine (PEPCID) 40 MG tablet Take 1 tablet every day by oral route. 10/03/21   [provider]  fluticasone (FLONASE) 50 MCG/ACT nasal spray Place 2 sprays into both nostrils daily. 08/05/23   Domenick Gong, MD  hydrochlorothiazide (HYDRODIURIL) 12.5 MG tablet Take 12.5 mg by mouth daily. 04/15/21   [provider]  lisinopril (ZESTRIL) 20 MG tablet 1 tablet DAILY (route: oral) 06/23/20   [provider]  pantoprazole (PROTONIX) 40 MG tablet 40 mg DAILY (route: oral) 06/23/20   [provider]  Rivaroxaban (XARELTO) 15 MG TABS tablet TAKE 1 TABLET BY MOUTH TWICE DAILY FOR 21 DAYS    [provider]  sertraline (ZOLOFT) 25 MG tablet Take 25 mg by mouth daily. 04/15/21   [provider]    Family History Family History  Problem Relation Age of Onset   Breast cancer Maternal Aunt     Social History Social History   Tobacco Use   Smoking status: Former    Current packs/day: 0.00    Types: Cigarettes    Quit date: 2022    Years since quitting: 3.2   Smokeless tobacco: Never  Vaping Use   Vaping status: Never Used  Substance Use Topics   Alcohol use: No   Drug use: Never     Allergies   Aleve [naproxen], Atorvastatin, Levofloxacin, Naproxen sodium, Levaquin [levofloxacin in d5w], Rosuvastatin, and Lovastatin   Review of Systems Review of Systems  Constitutional:  Positive for fatigue.  HENT:  Negative for congestion and rhinorrhea.   Eyes:  Negative for visual disturbance.  Respiratory:  Positive for shortness of breath.   Cardiovascular:  Negative for chest pain and leg swelling.  Gastrointestinal:  Positive for  diarrhea and nausea. Negative for vomiting.  Neurological:  Positive for dizziness, weakness and headaches. Negative for syncope, facial asymmetry and numbness.     Physical Exam Triage Vital Signs ED Triage Vitals  Encounter Vitals Group     BP 02/15/24 1317 (!) 100/58     Systolic BP Percentile --      Diastolic BP Percentile --      Pulse Rate 02/15/24 1317 65     Resp 02/15/24 1317 18     Temp 02/15/24 1317 98 F (36.7 C)     Temp Source 02/15/24 1317 Oral     SpO2 02/15/24 1317 99 %     Weight --      Height --      Head Circumference --      Peak Flow --      Pain Score 02/15/24 1312 3     Pain Loc --      Pain Education --  Exclude from Growth Chart --    No data found.  Updated Vital Signs BP (!) 100/58 (BP Location: Left Arm)   Pulse 65   Temp 98 F (36.7 C) (Oral)   Resp 18   SpO2 99%   Visual Acuity Right Eye Distance:   Left Eye Distance:   Bilateral Distance:    Right Eye Near:   Left Eye Near:    Bilateral Near:     Physical Exam Vitals and nursing note reviewed.  Constitutional:      Appearance: Normal appearance. She is not ill-appearing.  HENT:     Head: Normocephalic and atraumatic.     Right Ear: Tympanic membrane, ear canal and external ear normal. There is no impacted cerumen.     Left Ear: Tympanic membrane, ear canal and external ear normal. There is no impacted cerumen.     Nose: Nose normal. No congestion or rhinorrhea.     Mouth/Throat:     Mouth: Mucous membranes are moist.     Pharynx: Oropharynx is clear. No oropharyngeal exudate or posterior oropharyngeal erythema.  Cardiovascular:     Rate and Rhythm: Normal rate and regular rhythm.     Pulses: Normal pulses.     Heart sounds: Normal heart sounds. No murmur heard.    No friction rub. No gallop.  Pulmonary:     Effort: Pulmonary effort is normal.     Breath sounds: Normal breath sounds. No wheezing, rhonchi or rales.  Musculoskeletal:     Cervical back: Normal range  of motion and neck supple. No tenderness.  Lymphadenopathy:     Cervical: No cervical adenopathy.  Skin:    General: Skin is warm and dry.     Capillary Refill: Capillary refill takes less than 2 seconds.     Coloration: Skin is not pale.     Findings: No erythema.  Neurological:     General: No focal deficit present.     Mental Status: She is alert and oriented to person, place, and time.     Cranial Nerves: No cranial nerve deficit.     Sensory: No sensory deficit.     Motor: Weakness present.     Coordination: Coordination normal.     Gait: Gait normal.     Deep Tendon Reflexes: Reflexes normal.     Comments: Positive Romberg      UC Treatments / Results  Labs (all labs ordered are listed, but only abnormal results are displayed) Labs Reviewed  COMPREHENSIVE METABOLIC PANEL - Abnormal; Notable for the following components:      Result Value   Sodium 134 (*)    Glucose, Bld 106 (*)    Creatinine, Ser 1.02 (*)    GFR, Estimated 59 (*)    All other components within normal limits  CBC WITH DIFFERENTIAL/PLATELET    EKG Normal sinus rhythm with a ventricular rate of 61 bpm Peer interval 166 ms QRS duration 70 ms QT/QTc 430/4 and 32 ms No ST or T wave abnormalities noted.  Radiology CT Head Wo Contrast Result Date: 02/15/2024 CLINICAL DATA:  Dizziness and headache for 2 weeks EXAM: CT HEAD WITHOUT CONTRAST TECHNIQUE: Contiguous axial images were obtained from the base of the skull through the vertex without intravenous contrast. RADIATION DOSE REDUCTION: This exam was performed according to the departmental dose-optimization program which includes automated exposure control, adjustment of the mA and/or kV according to patient size and/or use of iterative reconstruction technique. COMPARISON:  04/17/2016 FINDINGS: Brain: No acute  infarct or hemorrhage. The lateral ventricles and midline structures are unremarkable. No acute extra-axial fluid collections. No mass effect.  Vascular: No hyperdense vessel or unexpected calcification. Skull: Normal. Negative for fracture or focal lesion. Sinuses/Orbits: No acute finding. Other: None. IMPRESSION: 1. No acute intracranial process. Electronically Signed   By: Sharlet Salina M.D.   On: 02/15/2024 15:37   DG Chest 2 View Result Date: 02/15/2024 CLINICAL DATA:  Dizziness and headache for 2 weeks, weakness EXAM: CHEST - 2 VIEW COMPARISON:  02/24/2023 FINDINGS: Frontal and lateral views of the chest demonstrate a stable cardiac silhouette. Continued ectasia and atherosclerosis of the thoracic aorta. No acute airspace disease, effusion, or pneumothorax. No acute bony abnormalities. IMPRESSION: 1. Stable chest, no acute process. Electronically Signed   By: Sharlet Salina M.D.   On: 02/15/2024 15:35    Procedures Procedures (including critical care time)  Medications Ordered in UC Medications - No data to display  Initial Impression / Assessment and Plan / UC Course  I have reviewed the triage vital signs and the nursing notes.  Pertinent labs & imaging results that were available during my care of the patient were reviewed by me and considered in my medical decision making (see chart for details).   Patient is a nontoxic-appearing 73 year old female presenting for evaluation of frontal and bitemporal headache, weakness, and dizziness present for the last 2 weeks.  The headache is constant and varies between a 3/10 up to an 8/10.  No associated change in vision.  The patient reports that she generally feels weak but denies any numbness, tingling, weakness in any of her extremities.  Physical exam reveals cranial nerves II through X intact.  Pupils equal and reactive and EOM is intact.  Bilateral grips, upper extremity strength, lower extremity strength are 5/5.  DTRs are 2+ globally.  Patient has normal finger-to-nose and heel-to-shin.  Negative pronator drift.  Patient does have a positive Romberg.  She does have a history of  migraine with vertigo but states that this feels different.  There is no room spinning is more a feeling of being off balance.  Differential diagnosis include CVA, intracranial mass, electrolyte imbalance, MI, migraine headache.  I will order a noncontrast CT of the head to evaluate any acute intracranial process, chest x-ray due to her shortness of breath to evaluate any acute cardiopulmonary process, EKG to evaluate for any cardiac cause of her symptoms, CBC, CMP.  EKG shows normal sinus rhythm without ST or T wave abnormalities.  No appreciable change when compared to EKG from 08/05/2023.  CBC is unremarkable with a normal white count of 8.0.  H&H is normal at 13.4 and 40.9 respectively.  Platelets normal at 25.  No advised the differential.  CMP shows mild hyponatremia with a sodium of 134.  Glucose is mildly elevated 106.  Creatinine is also mildly elevated at 1.02.  Transaminases are unremarkable.  Creatinine is mildly elevated when compared to previous level taken at [facility] dated 12/03/2023.  At that time her creatinine was 1.  Chest x-ray independently reviewed and evaluated by me.  Impression: Blunting of bilateral costophrenic angles with hazy appearance of the bilateral bases.  Cardiomediastinal silhouette appears normal.  Radiology impression is pending. Radiology impression states stable chest with no acute findings.  Head CT independently reviewed and evaluated by me.  Impression: No evidence of acute bleed or mass lesion.  No ventricle dilation or midline shift.  Radiology overread is pending. Radiology impression states no acute intracranial  process.  Radiology reading room contacted about radiology impression of both chest x-ray and head CT.  Etiology of the patient's headache and dizziness is unclear, though it may be a migraine with vertigo.  I will discharge her home on meclizine 12.5 mg that she can take every 8 hours as needed for dizziness and also start her on  Maxalt.  She reports that she cannot remember the exact medication that she was on previously for her migraines because she has been retired for over 15 years.  She also does not clearly remember what the previous presentation of her migraines was.  She would prefer to follow-up with [facility] so I will have her contact her primary care provider for referral to neurology.  Return and ER precautions reviewed.   Final Clinical Impressions(s) / UC Diagnoses   Final diagnoses:  Fatigue, unspecified type  Transient ischemic attack  Intractable migraine with status migrainosus, unspecified migraine type  Vertigo     Discharge Instructions      Your head CT, blood work, EKG, chest x-ray were all very reassuring.  The exact cause of your headache and dizziness is unclear.  It may be a new presentation of your previous migraines and vertigo.  Use the meclizine 12.5 mg every 8 hours as needed for dizziness and vertigo-like symptoms.  Use the Maxalt as needed for your headache pain.  You may take 1 tablet at onset of headache and repeat in 2 hours.  No more than a maximum of 3 tablets in a 24-hour period however.  Contact your primary care provider and ask for a referral to neurology for further evaluation and management of your symptoms, especially if they do not improve.  If you have any sharp increase in your headache pain, develop changes in your vision, develop forceful nausea vomiting, develop numbness, tingling, weakness in any of your extremities, or develop any fainting you need to call 911 and go to the ER.     ED Prescriptions     Medication Sig Dispense Auth. Provider   meclizine (ANTIVERT) 12.5 MG tablet Take 1 tablet (12.5 mg total) by mouth 3 (three) times daily as needed for dizziness. 30 tablet Becky Augusta, NP   rizatriptan (MAXALT) 10 MG tablet Take 1 tablet (10 mg total) by mouth as needed for migraine. May repeat in 2 hours if needed 10 tablet Becky Augusta, NP      PDMP not  reviewed this encounter.   Becky Augusta, NP 02/15/24 [phone]

## 2024-02-15 NOTE — ED Triage Notes (Signed)
 Pt presents with dizziness and a headache x 2 weeks. Pt has taken OTC pain medication and allergy medication for her symptoms. Pt also reports that her BP was low last week and she feels weak.

## 2024-02-24 ENCOUNTER — Ambulatory Visit
Admission: RE | Admit: 2024-02-24 | Discharge: 2024-02-24 | Disposition: A | Discharge status: Home / Self Care | Source: Ambulatory Visit | Attending: Family Medicine | Admitting: Family Medicine

## 2024-02-24 DIAGNOSIS — E2839 Other primary ovarian failure: Secondary | ICD-10-CM | POA: Diagnosis present

## 2024-05-10 ENCOUNTER — Encounter: Payer: Self-pay | Admitting: Dermatology

## 2024-05-10 ENCOUNTER — Other Ambulatory Visit: Payer: Self-pay | Admitting: Dermatology

## 2024-05-10 ENCOUNTER — Ambulatory Visit (INDEPENDENT_AMBULATORY_CARE_PROVIDER_SITE_OTHER): Admitting: Dermatology

## 2024-05-10 DIAGNOSIS — L723 Sebaceous cyst: Secondary | ICD-10-CM

## 2024-05-10 DIAGNOSIS — L03111 Cellulitis of right axilla: Secondary | ICD-10-CM

## 2024-05-10 DIAGNOSIS — Z7189 Other specified counseling: Secondary | ICD-10-CM

## 2024-05-10 DIAGNOSIS — H01113 Allergic dermatitis of right eye, unspecified eyelid: Secondary | ICD-10-CM

## 2024-05-10 DIAGNOSIS — H019 Unspecified inflammation of eyelid: Secondary | ICD-10-CM | POA: Diagnosis not present

## 2024-05-10 DIAGNOSIS — Z79899 Other long term (current) drug therapy: Secondary | ICD-10-CM

## 2024-05-10 DIAGNOSIS — R21 Rash and other nonspecific skin eruption: Secondary | ICD-10-CM

## 2024-05-10 MED ORDER — DOXYCYCLINE MONOHYDRATE 100 MG PO CAPS
ORAL_CAPSULE | ORAL | 0 refills | Status: AC
Start: 2024-05-10 — End: ?

## 2024-05-10 MED ORDER — MUPIROCIN 2 % EX OINT
TOPICAL_OINTMENT | CUTANEOUS | 0 refills | Status: AC
Start: 2024-05-10 — End: ?

## 2024-05-10 MED ORDER — PIMECROLIMUS 1 % EX CREA: TOPICAL_CREAM | CUTANEOUS | 0 refills | Status: DC

## 2024-05-10 NOTE — Progress Notes (Signed)
   Follow-Up Visit   Subjective  Carly Lopez is a 73 y.o. female who presents for the following: Inflamed cyst of the R axilla, tender, painful, patient would like to discuss treatment options. Pt c/o rash on the R eyelid that is itchy and inflamed.  The following portions of the chart were reviewed this encounter and updated as appropriate: medications, allergies, medical history  Review of Systems:  No other skin or systemic complaints except as noted in HPI or Assessment and Plan.  Objective  Well appearing patient in no apparent distress; mood and affect are within normal limits. A focused examination was performed of the following areas: the R axilla, face  Relevant exam findings are noted in the Assessment and Plan.  Right Axilla Inflamed nodule.  Assessment & Plan   Rash  = Eyelid Dermatitis Exam: R upper medial eyelid - Erythema Differential diagnosis:  Eyelid dermatitis  Treatment Plan: Start Elidel 0.1% cream BID x 2 weeks.  Discussed patch testing if patient continues to flare.  INFLAMED SEBACEOUS CYST Right Axilla Ruptured inflamed cyst with cellulitis -   Start Doxycycline 100 mg po BID x 10 days. Doxycycline should be taken with food to prevent nausea. Do not lay down for 30 minutes after taking. Be cautious with sun exposure and use good sun protection while on this medication. Pregnant women should not take this medication.   Start Mupirocin 2% ointment to aa BID. Incision and Drainage - Right Axilla Location: R axilla  Informed Consent: Discussed risks (permanent scarring, light or dark discoloration, infection, pain, bleeding, bruising, redness, damage to adjacent structures, and recurrence of the lesion) and benefits of the procedure, as well as the alternatives.  Informed consent was obtained.  Preparation: The area was prepped with alcohol.  Anesthesia: Lidocaine 1% with epinephrine  Procedure Details: An incision was made overlying the  lesion. The lesion drained white, chalky cyst material and blood.  A small amount of fluid was drained.   Pieces of cyst wall were extracted.    Antibiotic ointment and a sterile pressure dressing were applied. The patient tolerated procedure well.  Total number of lesions drained: 1  Plan: The patient was instructed on post-op care. Recommend OTC analgesia as needed for pain.   Skin excision - Right Axilla  Excision method:  punch Total excision diameter (cm):  2.5 Informed consent: discussed and consent obtained   Timeout: patient name, date of birth, surgical site, and procedure verified   Procedure prep:  Patient was prepped and draped in usual sterile fashion Prep type:  Isopropyl alcohol and povidone-iodine Anesthesia: the lesion was anesthetized in a standard fashion   Anesthetic:  1% lidocaine w/ epinephrine 1-100,000 buffered w/ 8.4% NaHCO3 Instrument used comment:  6.0 mm punch Hemostasis achieved with: pressure   Hemostasis achieved with comment:  Electrocautery Outcome: patient tolerated procedure well with no complications   Post-procedure details: sterile dressing applied and wound care instructions given   Dressing type: bandage and bacitracin   Additional details:  Simple excision  Return if symptoms worsen or fail to improve.  Arlinda Lais, CMA, am acting as scribe for Celine Collard, MD .   Documentation: I have reviewed the above documentation for accuracy and completeness, and I agree with the above.  Celine Collard, MD

## 2024-05-10 NOTE — Patient Instructions (Signed)

## 2024-05-11 ENCOUNTER — Telehealth: Payer: Self-pay

## 2024-05-11 NOTE — Telephone Encounter (Signed)
 ERROR

## 2024-05-22 ENCOUNTER — Ambulatory Visit

## 2024-05-22 ENCOUNTER — Other Ambulatory Visit: Payer: Self-pay | Admitting: Physician Assistant

## 2024-05-22 DIAGNOSIS — M79662 Pain in left lower leg: Secondary | ICD-10-CM

## 2024-05-23 ENCOUNTER — Ambulatory Visit
Admission: RE | Admit: 2024-05-23 | Discharge: 2024-05-23 | Disposition: A | Discharge status: Home / Self Care | Source: Ambulatory Visit | Attending: Physician Assistant | Admitting: Physician Assistant

## 2024-05-23 DIAGNOSIS — M79662 Pain in left lower leg: Secondary | ICD-10-CM | POA: Diagnosis present

## 2024-06-30 ENCOUNTER — Ambulatory Visit
Admission: RE | Admit: 2024-06-30 | Discharge: 2024-06-30 | Disposition: A | Discharge status: Home / Self Care | Source: Ambulatory Visit | Attending: Emergency Medicine | Admitting: Emergency Medicine

## 2024-06-30 VITALS — BP 109/72 | HR 72 | Temp 98.7°F | Resp 20 | Ht 63.0 in | Wt 178.0 lb

## 2024-06-30 DIAGNOSIS — R55 Syncope and collapse: Secondary | ICD-10-CM | POA: Diagnosis not present

## 2024-06-30 DIAGNOSIS — R42 Dizziness and giddiness: Secondary | ICD-10-CM | POA: Diagnosis not present

## 2024-06-30 LAB — GLUCOSE, CAPILLARY: Glucose-Capillary: 128 mg/dL — ABNORMAL HIGH (ref 70–99)

## 2024-06-30 NOTE — ED Triage Notes (Signed)
 Pt c/o dizziness, ringing in ears  Pt states that she was dizzy on Monday and by Tuesday she was unable to stand up  Pt took an old prescription for vertigo  Pt states that she has episodes of near syncope with nausea and is unsure if that is because she was moving too fast or low blood pressure  Pt was given meclizine  for her symptoms in March.

## 2024-06-30 NOTE — ED Provider Notes (Signed)
 [facility]    CSN: [id number] Arrival date & time: 06/30/24  1034      History   Chief Complaint Chief Complaint  Patient presents with   Dizziness    HPI Tyshia Fenter Whiten is a 73 y.o. female.   Aria Jarrard, 73 year old female, presents to urgent care for evaluation of recurrent dizziness, near syncope and nausea since Monday, pt concerned it may be her vertigo, blood pressure or stroke. GCS 15. Pt is drinking well,voiding well.  Patient has past  medical history significant for malignant neoplasm of right upper lobe of lung, GERD, depression, spinal meningioma, history of TIA, fatigue, GERD, stage III kidney disease, and migraine headache with vertigo  Pt's brother drove pt.  The history is provided by the patient. No language interpreter was used.    Past Medical History:  Diagnosis Date   Depression    GERD (gastroesophageal reflux disease)    Malignant neoplasm of right upper lobe of lung (HCC) 01/05/2021    Patient Active Problem List   Diagnosis Date Noted   Dizziness 06/30/2024   Near syncope 06/30/2024   Spinal meningioma (HCC) 10/06/2022   Single subsegmental pulmonary embolism without acute cor pulmonale (HCC) 03/07/2022   DOE (dyspnea on exertion) 02/27/2022   Fatigue 02/27/2022   S/P lobectomy of lung 01/21/2022   Generalized anxiety disorder 12/24/2021   Preoperative evaluation of a medical condition to rule out surgical contraindications (TAR required) 12/08/2021   Former tobacco use 06/09/2021   Stage 3a chronic kidney disease (HCC) 04/18/2021   Malignant neoplasm of right upper lobe of lung (HCC) 01/05/2021   Primary insomnia 01/05/2021   Aftercare following joint replacement 06/23/2020   Presence of right artificial knee joint 06/20/2020   Essential hypertension 12/01/2019   Major depressive disorder with single episode, 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/01/2019   Migraine with vertigo 09/25/2019   History of TIA (transient  ischemic attack) 04/04/2019   Smoking 01/04/2019   Transient ischemic attack 12/29/2018   Bochdalek hernia 03/24/2018   OA (osteoarthritis) of hip 07/13/2014   Hypercholesterolemia 10/09/2011   Irritable bowel syndrome with both constipation and diarrhea 10/09/2011   Gastroesophageal reflux disease without esophagitis 06/25/2011   History of diverticulitis of colon 06/25/2011   Osteopenia 06/25/2011    Past Surgical History:  Procedure Laterality Date   ABDOMINAL HYSTERECTOMY     CHOLECYSTECTOMY     TONSILLECTOMY      OB History   No obstetric history on file.      Home Medications    Prior to Admission medications   Medication Sig Start Date End Date Taking? Authorizing Provider  aspirin 81 MG EC tablet aspirin 81 mg tablet  Take by oral route.   Yes [provider]  atorvastatin (LIPITOR) 20 MG tablet 20 mg DAILY (route: oral) 06/23/20  Yes [provider]  buPROPion (WELLBUTRIN SR) 150 MG 12 hr tablet 1 tablet DAILY (route: oral) 05/30/20  Yes [provider]  Cholecalciferol 50 MCG (2000 UT) CAPS Take by mouth.   Yes [provider]  famotidine (PEPCID) 40 MG tablet Take 1 tablet every day by oral route. 10/03/21  Yes [provider]  fluticasone  (FLONASE ) 50 MCG/ACT nasal spray Place 2 sprays into both nostrils daily. 08/05/23  Yes Mortenson, Ashley, MD  hydrochlorothiazide (HYDRODIURIL) 12.5 MG tablet Take 12.5 mg by mouth daily. 04/15/21  Yes [provider]  lisinopril (ZESTRIL) 20 MG tablet 1 tablet DAILY (route: oral) 06/23/20  Yes [provider]  meclizine  (ANTIVERT ) 12.5 MG tablet Take 1 tablet (12.5 mg total) by mouth 3 (three) times daily as needed for dizziness. 02/15/24  Yes Bernardino Ditch, NP  mupirocin  ointment (BACTROBAN ) 2 % Apply to draining cyst QD until healed. 05/10/24  Yes Hester Alm BROCKS, MD  pantoprazole (PROTONIX) 40 MG tablet 40 mg DAILY (route: oral) 06/23/20  Yes [provider]   Rivaroxaban (XARELTO) 15 MG TABS tablet TAKE 1 TABLET BY MOUTH TWICE DAILY FOR 21 DAYS   Yes [provider]  sertraline (ZOLOFT) 25 MG tablet Take 25 mg by mouth daily. 04/15/21  Yes [provider]  doxycycline  (MONODOX ) 100 MG capsule Take one cap po BID x 10 days. Take with food and plenty of drink. 05/10/24   Hester Alm BROCKS, MD  pimecrolimus  (ELIDEL ) 1 % cream Apply 0.5 grams to the R eyelid BID PRN. 05/10/24   Hester Alm BROCKS, MD  rizatriptan  (MAXALT ) 10 MG tablet Take 1 tablet (10 mg total) by mouth as needed for migraine. May repeat in 2 hours if needed 02/15/24   Bernardino Ditch, NP    Family History Family History  Problem Relation Age of Onset   Breast cancer Maternal Aunt     Social History Social History   Tobacco Use   Smoking status: Former    Current packs/day: 0.00    Types: Cigarettes    Quit date: 2022    Years since quitting: 3.5   Smokeless tobacco: Never  Vaping Use   Vaping status: Never Used  Substance Use Topics   Alcohol use: No   Drug use: Never     Allergies   Aleve [naproxen], Atorvastatin, Levofloxacin, Naproxen sodium, Levaquin [levofloxacin in d5w], Rosuvastatin, and Lovastatin   Review of Systems Review of Systems  Constitutional:  Negative for diaphoresis and fever.  HENT:  Negative for congestion.   Respiratory:  Negative for cough.   Neurological:  Positive for dizziness and syncope.  All other systems reviewed and are negative.    Physical Exam Triage Vital Signs ED Triage Vitals  Encounter Vitals Group     BP 06/30/24 1056 109/72     Girls Systolic BP Percentile --      Girls Diastolic BP Percentile --      Boys Systolic BP Percentile --      Boys Diastolic BP Percentile --      Pulse Rate 06/30/24 1056 72     Resp 06/30/24 1056 20     Temp 06/30/24 1056 98.7 F (37.1 C)     Temp Source 06/30/24 1056 Oral     SpO2 06/30/24 1056 96 %     Weight 06/30/24 1054 178 lb (80.7 kg)     Height 06/30/24 1054 5'  3 (1.6 m)     Head Circumference --      Peak Flow --      Pain Score 06/30/24 1054 0     Pain Loc --      Pain Education --      Exclude from Growth Chart --    No data found.  Updated Vital Signs BP 109/72 (BP Location: Left Arm)   Pulse 72   Temp 98.7 F (37.1 C) (Oral)   Resp 20   Ht 5' 3 (1.6 m)   Wt 178 lb (80.7 kg)   SpO2 96%   BMI 31.53 kg/m   Visual Acuity Right Eye Distance:   Left Eye Distance:   Bilateral Distance:    Right Eye Near:  Left Eye Near:    Bilateral Near:     Physical Exam Vitals and nursing note reviewed.  Constitutional:      General: She is not in acute distress.    Appearance: She is well-developed and well-groomed.  HENT:     Head: Normocephalic and atraumatic.  Eyes:     Conjunctiva/sclera: Conjunctivae normal.  Cardiovascular:     Rate and Rhythm: Normal rate and regular rhythm.     Heart sounds: Normal heart sounds. No murmur heard. Pulmonary:     Effort: Pulmonary effort is normal. No respiratory distress.     Breath sounds: Normal breath sounds and air entry.  Abdominal:     Palpations: Abdomen is soft.     Tenderness: There is no abdominal tenderness.  Musculoskeletal:        General: No swelling.     Cervical back: Normal range of motion and neck supple.  Skin:    General: Skin is warm and dry.     Capillary Refill: Capillary refill takes less than 2 seconds.  Neurological:     General: No focal deficit present.     Mental Status: She is alert and oriented to person, place, and time.     GCS: GCS eye subscore is 4. GCS verbal subscore is 5. GCS motor subscore is 6.  Psychiatric:        Attention and Perception: Attention normal.        Mood and Affect: Mood normal.        Speech: Speech normal.        Behavior: Behavior normal. Behavior is cooperative.      UC Treatments / Results  Labs (all labs ordered are listed, but only abnormal results are displayed) Labs Reviewed  GLUCOSE, CAPILLARY - Abnormal;  Notable for the following components:      Result Value   Glucose-Capillary 128 (*)    All other components within normal limits  CBG MONITORING, ED    EKG   Radiology No results found.  Procedures Procedures (including critical care time)  Medications Ordered in UC Medications - No data to display  Initial Impression / Assessment and Plan / UC Course  I have reviewed the triage vital signs and the nursing notes.  Pertinent labs & imaging results that were available during my care of the patient were reviewed by me and considered in my medical decision making (see chart for details).  Clinical Course as of 06/30/24 1257  Fri Jun 30, 2024  1142 Discussed with pt we will check EKG,blood sugar, will need to refer patient to ER for further workup(CT/MRI, labs, CM, etc).  [JD]  1145 EKG shows NSR rate 60, QTC 438, no ectopy, no STEMI [JD]    Clinical Course User Index [JD] Madge Therrien, Rilla, NP  Discussed exam findings and plan of care with pt, recommend pt go to Er for further evaluation and work up, pt verbalized understanding to this provider.  Ddx: Dizziness, vertigo, near syncope, TIA,CVA Final Clinical Impressions(s) / UC Diagnoses   Final diagnoses:  Dizziness  Near syncope     Discharge Instructions      Please go to Er for further evaluation of near syncopal episodes and dizziness     ED Prescriptions   None    PDMP not reviewed this encounter.   Aminta Rilla, NP 06/30/24 1257

## 2024-06-30 NOTE — Discharge Instructions (Signed)
 Please go to Er for further evaluation of near syncopal episodes and dizziness

## 2024-06-30 NOTE — ED Notes (Signed)
 Patient is being discharged from the Urgent Care and sent to the Emergency Department via POV . Per Rilla Flood, NP, patient is in need of higher level of care due to Dizziness and near syncope. Patient is aware and verbalizes understanding of plan of care.  Vitals:   06/30/24 1056  BP: 109/72  Pulse: 72  Resp: 20  Temp: 98.7 F (37.1 C)  SpO2: 96%

## 2024-07-26 ENCOUNTER — Other Ambulatory Visit: Payer: Self-pay | Admitting: Family Medicine

## 2024-07-26 DIAGNOSIS — Z1231 Encounter for screening mammogram for malignant neoplasm of breast: Secondary | ICD-10-CM

## 2024-09-05 ENCOUNTER — Ambulatory Visit
Admission: RE | Admit: 2024-09-05 | Discharge: 2024-09-05 | Disposition: A | Discharge status: Home / Self Care | Source: Ambulatory Visit | Attending: Family Medicine | Admitting: Family Medicine

## 2024-09-05 DIAGNOSIS — Z1231 Encounter for screening mammogram for malignant neoplasm of breast: Secondary | ICD-10-CM | POA: Diagnosis present

## 2024-10-20 ENCOUNTER — Ambulatory Visit
Admission: RE | Admit: 2024-10-20 | Discharge: 2024-10-20 | Disposition: A | Discharge status: Home / Self Care | Source: Ambulatory Visit | Attending: Physical Medicine & Rehabilitation | Admitting: Physical Medicine & Rehabilitation

## 2024-10-20 DIAGNOSIS — M5441 Lumbago with sciatica, right side: Secondary | ICD-10-CM | POA: Insufficient documentation

## 2024-10-20 DIAGNOSIS — M5442 Lumbago with sciatica, left side: Secondary | ICD-10-CM | POA: Insufficient documentation

## 2024-10-20 DIAGNOSIS — G8929 Other chronic pain: Secondary | ICD-10-CM | POA: Insufficient documentation

## 2024-10-20 MED ORDER — GADOBUTROL 1 MMOL/ML IV SOLN
7.5000 mL | Freq: Once | INTRAVENOUS | Status: AC | PRN
  Administered 2024-10-20: 7.5 mL via INTRAVENOUS
